# Patient Record
Sex: Male | Born: 1960 | Race: Black or African American | Hispanic: No | Marital: Married | Smoking: Current every day smoker
Health system: Southern US, Community
[De-identification: ages and names within clinical notes are randomized; demographics above are authoritative.]

## PROBLEM LIST (undated history)

## (undated) DIAGNOSIS — J45909 Unspecified asthma, uncomplicated: Secondary | ICD-10-CM

## (undated) HISTORY — PX: SHOULDER SURGERY: SHX246

---

## 1999-05-22 ENCOUNTER — Emergency Department (HOSPITAL_COMMUNITY): Admission: EM | Admit: 1999-05-22 | Discharge: 1999-05-22 | Payer: Self-pay | Admitting: Emergency Medicine

## 1999-11-13 ENCOUNTER — Emergency Department (HOSPITAL_COMMUNITY): Admission: EM | Admit: 1999-11-13 | Discharge: 1999-11-13 | Payer: Self-pay | Admitting: *Deleted

## 2000-12-24 ENCOUNTER — Emergency Department (HOSPITAL_COMMUNITY): Admission: EM | Admit: 2000-12-24 | Discharge: 2000-12-24 | Payer: Self-pay | Admitting: Emergency Medicine

## 2001-04-01 ENCOUNTER — Encounter: Payer: Self-pay | Admitting: Emergency Medicine

## 2001-04-01 ENCOUNTER — Emergency Department (HOSPITAL_COMMUNITY): Admission: EM | Admit: 2001-04-01 | Discharge: 2001-04-01 | Payer: Self-pay

## 2001-08-30 ENCOUNTER — Emergency Department (HOSPITAL_COMMUNITY): Admission: EM | Admit: 2001-08-30 | Discharge: 2001-08-30 | Payer: Self-pay | Admitting: Emergency Medicine

## 2002-03-30 ENCOUNTER — Emergency Department (HOSPITAL_COMMUNITY): Admission: EM | Admit: 2002-03-30 | Discharge: 2002-03-31 | Payer: Self-pay | Admitting: Emergency Medicine

## 2002-03-30 ENCOUNTER — Encounter: Payer: Self-pay | Admitting: Emergency Medicine

## 2002-04-28 ENCOUNTER — Emergency Department (HOSPITAL_COMMUNITY): Admission: EM | Admit: 2002-04-28 | Discharge: 2002-04-29 | Payer: Self-pay | Admitting: Emergency Medicine

## 2002-04-28 ENCOUNTER — Encounter: Payer: Self-pay | Admitting: Emergency Medicine

## 2002-05-01 ENCOUNTER — Ambulatory Visit (HOSPITAL_BASED_OUTPATIENT_CLINIC_OR_DEPARTMENT_OTHER): Admission: RE | Admit: 2002-05-01 | Discharge: 2002-05-01 | Payer: Self-pay | Admitting: Orthopedic Surgery

## 2002-11-08 ENCOUNTER — Emergency Department (HOSPITAL_COMMUNITY): Admission: EM | Admit: 2002-11-08 | Discharge: 2002-11-08 | Payer: Self-pay | Admitting: Emergency Medicine

## 2002-11-19 ENCOUNTER — Encounter: Payer: Self-pay | Admitting: *Deleted

## 2002-11-19 ENCOUNTER — Emergency Department (HOSPITAL_COMMUNITY): Admission: EM | Admit: 2002-11-19 | Discharge: 2002-11-19 | Payer: Self-pay | Admitting: *Deleted

## 2003-02-06 ENCOUNTER — Emergency Department (HOSPITAL_COMMUNITY): Admission: EM | Admit: 2003-02-06 | Discharge: 2003-02-06 | Payer: Self-pay | Admitting: *Deleted

## 2003-02-09 ENCOUNTER — Encounter: Admission: RE | Admit: 2003-02-09 | Discharge: 2003-02-09 | Payer: Self-pay | Admitting: Internal Medicine

## 2003-02-21 ENCOUNTER — Emergency Department (HOSPITAL_COMMUNITY): Admission: AD | Admit: 2003-02-21 | Discharge: 2003-02-21 | Payer: Self-pay | Admitting: Emergency Medicine

## 2003-02-26 ENCOUNTER — Encounter: Admission: RE | Admit: 2003-02-26 | Discharge: 2003-02-26 | Payer: Self-pay | Admitting: Internal Medicine

## 2003-08-06 ENCOUNTER — Emergency Department (HOSPITAL_COMMUNITY): Admission: EM | Admit: 2003-08-06 | Discharge: 2003-08-06 | Payer: Self-pay | Admitting: Family Medicine

## 2003-08-06 ENCOUNTER — Emergency Department (HOSPITAL_COMMUNITY): Admission: EM | Admit: 2003-08-06 | Discharge: 2003-08-06 | Payer: Self-pay | Admitting: Emergency Medicine

## 2003-10-03 ENCOUNTER — Emergency Department (HOSPITAL_COMMUNITY): Admission: AD | Admit: 2003-10-03 | Discharge: 2003-10-03 | Payer: Self-pay | Admitting: Family Medicine

## 2003-11-29 ENCOUNTER — Emergency Department (HOSPITAL_COMMUNITY): Admission: EM | Admit: 2003-11-29 | Discharge: 2003-11-29 | Payer: Self-pay | Admitting: Family Medicine

## 2004-02-07 ENCOUNTER — Emergency Department (HOSPITAL_COMMUNITY): Admission: EM | Admit: 2004-02-07 | Discharge: 2004-02-07 | Payer: Self-pay | Admitting: Emergency Medicine

## 2004-08-07 ENCOUNTER — Emergency Department (HOSPITAL_COMMUNITY): Admission: EM | Admit: 2004-08-07 | Discharge: 2004-08-07 | Payer: Self-pay | Admitting: Emergency Medicine

## 2004-08-20 ENCOUNTER — Emergency Department (HOSPITAL_COMMUNITY): Admission: AD | Admit: 2004-08-20 | Discharge: 2004-08-20 | Payer: Self-pay | Admitting: Family Medicine

## 2004-09-27 ENCOUNTER — Emergency Department (HOSPITAL_COMMUNITY): Admission: EM | Admit: 2004-09-27 | Discharge: 2004-09-27 | Payer: Self-pay | Admitting: Family Medicine

## 2005-03-05 ENCOUNTER — Emergency Department (HOSPITAL_COMMUNITY): Admission: EM | Admit: 2005-03-05 | Discharge: 2005-03-06 | Payer: Self-pay | Admitting: *Deleted

## 2005-06-08 ENCOUNTER — Emergency Department (HOSPITAL_COMMUNITY): Admission: EM | Admit: 2005-06-08 | Discharge: 2005-06-08 | Payer: Self-pay | Admitting: Family Medicine

## 2005-09-14 ENCOUNTER — Emergency Department (HOSPITAL_COMMUNITY): Admission: EM | Admit: 2005-09-14 | Discharge: 2005-09-14 | Payer: Self-pay | Admitting: Emergency Medicine

## 2005-12-07 ENCOUNTER — Emergency Department (HOSPITAL_COMMUNITY): Admission: EM | Admit: 2005-12-07 | Discharge: 2005-12-07 | Payer: Self-pay | Admitting: Emergency Medicine

## 2006-08-07 ENCOUNTER — Emergency Department (HOSPITAL_COMMUNITY): Admission: EM | Admit: 2006-08-07 | Discharge: 2006-08-07 | Payer: Self-pay | Admitting: Emergency Medicine

## 2007-12-07 ENCOUNTER — Emergency Department (HOSPITAL_COMMUNITY): Admission: EM | Admit: 2007-12-07 | Discharge: 2007-12-07 | Payer: Self-pay | Admitting: Emergency Medicine

## 2008-09-19 ENCOUNTER — Emergency Department (HOSPITAL_COMMUNITY): Admission: EM | Admit: 2008-09-19 | Discharge: 2008-09-19 | Payer: Self-pay | Admitting: Family Medicine

## 2008-12-13 ENCOUNTER — Emergency Department (HOSPITAL_COMMUNITY): Admission: EM | Admit: 2008-12-13 | Discharge: 2008-12-13 | Payer: Self-pay | Admitting: Family Medicine

## 2010-05-25 ENCOUNTER — Ambulatory Visit: Payer: Self-pay | Admitting: Family Medicine

## 2010-09-20 ENCOUNTER — Inpatient Hospital Stay (INDEPENDENT_AMBULATORY_CARE_PROVIDER_SITE_OTHER)
Admission: RE | Admit: 2010-09-20 | Discharge: 2010-09-20 | Disposition: A | Discharge status: Home / Self Care | Payer: Managed Care, Other (non HMO) | Source: Ambulatory Visit

## 2010-09-20 DIAGNOSIS — M549 Dorsalgia, unspecified: Secondary | ICD-10-CM

## 2010-09-20 DIAGNOSIS — IMO0002 Reserved for concepts with insufficient information to code with codable children: Secondary | ICD-10-CM

## 2010-11-03 NOTE — Op Note (Signed)
   NAME:  BLESSED, COTHAM NO.:  [id number]   MEDICAL RECORD NO.:  [id number]                   PATIENT TYPE:  AMB   LOCATION:  DSC                                  FACILITY:  [facility]   PHYSICIAN:  Cindee Salt, M.D.                    DATE OF BIRTH:  [birth year]   DATE OF PROCEDURE:  05/01/2002  DATE OF DISCHARGE:                                 OPERATIVE REPORT   PREOPERATIVE DIAGNOSIS:  Infection in the left little finger with probable  epidermal inclusion cyst.   POSTOPERATIVE DIAGNOSIS:  Infection in the left little finger with probable  epidermal inclusion cyst.   OPERATION:  Incision and drainage, removal of epidermal inclusion cyst, left  little finger.   SURGEON:  Cindee Salt, M.D.   ASSISTANT:  R.N.   ANESTHESIA:  Local.   INDICATIONS:  The patient is a 50 year old male with a history of  swelling  of his left little  finger. He was seen in the [facility] emergency  room, referred. He has a small pit on the proximal interphalangeal joint. He  was given an antibiotic along with a pain medication.   DESCRIPTION OF PROCEDURE:  The patient was brought to the operating room  where he was  prepped and draped using Duraprep, the left arm free. A  metacarpal block was given with 1% Xylocaine without epinephrine. The  tourniquet  was placed on the base of the finger was via a 1/4 inch Penrose.   After adequate anesthesia, an oblique incision was made. Purulent material  was immediately encountered. Cultures were taken for both aerobic and  anaerobic media. The area was evacuated. This was directly beneath the pit  in the skin. The pit was elliptically incised revealing a ruptured probable  epidermal inclusion cyst. The specimen was sent to pathology.   The wound was irrigated and packed. A sterile compressive dressing was  applied. The patient tolerated the procedure well. He is discharged home to  return on Monday on Vicodin and  Keflex.                                               Cindee Salt, M.D.    GK/MEDQ  D:  05/01/2002  T:  05/01/2002  Job:  [id number]

## 2010-11-23 ENCOUNTER — Inpatient Hospital Stay (INDEPENDENT_AMBULATORY_CARE_PROVIDER_SITE_OTHER)
Admission: RE | Admit: 2010-11-23 | Discharge: 2010-11-23 | Disposition: A | Discharge status: Home / Self Care | Payer: Managed Care, Other (non HMO) | Source: Ambulatory Visit | Attending: Family Medicine | Admitting: Family Medicine

## 2010-11-23 DIAGNOSIS — K047 Periapical abscess without sinus: Secondary | ICD-10-CM

## 2011-04-13 ENCOUNTER — Inpatient Hospital Stay (INDEPENDENT_AMBULATORY_CARE_PROVIDER_SITE_OTHER)
Admission: RE | Admit: 2011-04-13 | Discharge: 2011-04-13 | Disposition: A | Discharge status: Home / Self Care | Payer: Managed Care, Other (non HMO) | Source: Ambulatory Visit | Attending: Family Medicine | Admitting: Family Medicine

## 2011-04-13 DIAGNOSIS — K1120 Sialoadenitis, unspecified: Secondary | ICD-10-CM

## 2011-04-13 LAB — POCT RAPID STREP A: Streptococcus, Group A Screen (Direct): NEGATIVE

## 2011-11-24 ENCOUNTER — Emergency Department (INDEPENDENT_AMBULATORY_CARE_PROVIDER_SITE_OTHER): Payer: Managed Care, Other (non HMO)

## 2011-11-24 ENCOUNTER — Encounter (HOSPITAL_COMMUNITY): Payer: Self-pay

## 2011-11-24 ENCOUNTER — Emergency Department (INDEPENDENT_AMBULATORY_CARE_PROVIDER_SITE_OTHER)
Admission: EM | Admit: 2011-11-24 | Discharge: 2011-11-24 | Disposition: A | Discharge status: Home / Self Care | Payer: Managed Care, Other (non HMO) | Source: Home / Self Care | Attending: Emergency Medicine | Admitting: Emergency Medicine

## 2011-11-24 DIAGNOSIS — M20019 Mallet finger of unspecified finger(s): Secondary | ICD-10-CM

## 2011-11-24 NOTE — ED Provider Notes (Signed)
History     CSN: [id number]  Arrival date & time 11/24/11  1831   First MD Initiated Contact with Patient 11/24/11 1959      Chief Complaint  Patient presents with  . Finger Injury    (Consider location/radiation/quality/duration/timing/severity/associated sxs/prior treatment) HPI Comments: Pt was lifting an engine last week when finger became crushed between engine and cement wall.  Now cannot extend pinky finger all the way.   Patient is a 51 y.o. male presenting with hand injury. The history is provided by the patient.  Hand Injury  Incident onset: a week ago. The incident occurred at home. Injury mechanism: crush injury. The pain is present in the right fingers. The quality of the pain is described as aching. The pain is at a severity of 8/10. The pain is mild. The pain has been improving since the incident. Pertinent negatives include no fever. The symptoms are aggravated by use. He has tried nothing for the symptoms.    History reviewed. No pertinent past medical history.  History reviewed. No pertinent past surgical history.  History reviewed. No pertinent family history.  History  Substance Use Topics  . Smoking status: Current Everyday Smoker  . Smokeless tobacco: Not on file  . Alcohol Use: No      Review of Systems  Constitutional: Negative for fever and chills.  Musculoskeletal:       Finger injury  Neurological: Positive for weakness. Negative for numbness.    Allergies  Review of patient's allergies indicates no known allergies.  Home Medications   Current Outpatient Rx  Name Route Sig Dispense Refill  . NAPROXEN SODIUM 220 MG PO TABS Oral Take 220 mg by mouth 2 (two) times daily with a meal.      BP 111/76  Pulse 74  Temp(Src) 98 F (36.7 C) (Oral)  Resp 18  SpO2 99%  Physical Exam  Constitutional: He appears well-developed and well-nourished. No distress.  Pulmonary/Chest: Effort normal.  Musculoskeletal:       Right hand: He exhibits  deformity and swelling. He exhibits normal range of motion, no tenderness and no bony tenderness.       Pt cannot extend DIP of R fifth finger, it is always in flexion.  No pain with passive extension of finger. Mild swelling of R 5th finger.   Neurological:       Sensation in R 5th finger normal    ED Course  Procedures (including critical care time)  Labs Reviewed - No data to display Dg Finger Little Right  11/24/2011  *RADIOLOGY REPORT*  Clinical Data: Crush injury to the little finger, unable to straighten digit  RIGHT LITTLE FINGER 2+V  Comparison: None.  Findings: Tiny osseous density along the dorsal aspect of the middle phalanx, compatible with a dorsal avulsion injury of the base of the distal phalanx.  This appearance suggests avulsion of the extensor digitorum tendon (mallet finger).  IMPRESSION: Dorsal avulsion injury at the DIP joint, likely reflecting avulsion of the extensor digitorum tendon (mallet finger).  Original Report Authenticated By: Charline Bills, M.D.     1. Mallet finger       MDM  Pt splinted in hyperextension by tech.  Emphasized to pt need for f/u with hand specialist if pt is to regain function in finger.        Cathlyn Parsons, NP 11/24/11 2007

## 2011-11-24 NOTE — ED Provider Notes (Signed)
Medical screening examination/treatment/procedure(s) were performed by non-physician practitioner and as supervising physician I was immediately available for consultation/collaboration.  Rowyn Spilde   Bula Cavalieri, MD 11/24/11 2008 

## 2011-11-24 NOTE — Discharge Instructions (Signed)
Call Dr. Carlos Levering office Monday morning to schedule an appointment.  Make sure to tell them how long ago the injury happened.   Mallet Finger You have a hand injury called mallet finger. This means that the tendon which straightens the fingertip is torn. This injury usually results from jamming the end of the finger. A small fracture at the joint may also be present. Most of the time, this injury can be treated with a simple splint. Keep your finger elevated and use an icepack on it every 2-3 hours for several days to help reduce pain and to keep the swelling down. Although the injury often causes a deformity at the joint, surgery is not needed if a splint is used properly. A properly fit special finger splint will hold the tendon ends together until the tendon is healed. You must wear this splint continuously for at least 6-8 weeks, or you may end up with a permanent deformity. If the end joint of the finger bends at any time before healing is complete, the treatment may fail. Please do not remove this splint until your doctor approves. Be sure to see the doctor for follow-up care as recommended. If your finger becomes more painful, swollen, or red, you should see your doctor right away. MAKE SURE YOU:   Understand these instructions.   Will watch your condition.   Will get help right away if you are not doing well or get worse.  Document Released: 07/12/2004 Document Revised: 05/24/2011 Document Reviewed: 06/04/2005 [facility] Patient Information 2012 [place], [place].

## 2011-11-24 NOTE — ED Notes (Signed)
Pt was lifting engine out of car one week ago and finger became lodged in engine and having swelling and pain of rt pinkie finger.

## 2011-12-08 ENCOUNTER — Emergency Department (HOSPITAL_COMMUNITY)
Admission: EM | Admit: 2011-12-08 | Discharge: 2011-12-08 | Disposition: A | Discharge status: Home / Self Care | Payer: Managed Care, Other (non HMO) | Attending: Emergency Medicine | Admitting: Emergency Medicine

## 2011-12-08 ENCOUNTER — Encounter (HOSPITAL_COMMUNITY): Payer: Self-pay | Admitting: Nurse Practitioner

## 2011-12-08 DIAGNOSIS — K029 Dental caries, unspecified: Secondary | ICD-10-CM | POA: Insufficient documentation

## 2011-12-08 DIAGNOSIS — F17200 Nicotine dependence, unspecified, uncomplicated: Secondary | ICD-10-CM | POA: Insufficient documentation

## 2011-12-08 DIAGNOSIS — Z882 Allergy status to sulfonamides status: Secondary | ICD-10-CM | POA: Insufficient documentation

## 2011-12-08 MED ORDER — HYDROCODONE-ACETAMINOPHEN 5-325 MG PO TABS
2.0000 | ORAL_TABLET | Freq: Once | ORAL | Status: AC
  Administered 2011-12-08: 2 via ORAL
  Filled 2011-12-08: qty 2

## 2011-12-08 MED ORDER — CLINDAMYCIN HCL 300 MG PO CAPS
300.0000 mg | ORAL_CAPSULE | Freq: Once | ORAL | Status: DC
  Filled 2011-12-08 (×2): qty 1

## 2011-12-08 MED ORDER — CLINDAMYCIN HCL 300 MG PO CAPS: 300.0000 mg | ORAL_CAPSULE | Freq: Three times a day (TID) | ORAL | Status: AC

## 2011-12-08 NOTE — ED Provider Notes (Signed)
History   This chart was scribed for Cyndra Numbers, MD scribed by Magnus Sinning. The patient was seen in room TR05C/TR05C seen at 16:53.   CSN: [id number]  Arrival date & time 12/08/11  1411   First MD Initiated Contact with Patient 12/08/11 1646      Chief Complaint  Patient presents with  . Dental Pain    (Consider location/radiation/quality/duration/timing/severity/associated sxs/prior treatment) HPI Steven Cobb is a 51 y.o. male who presents to the Emergency Department complaining of gradually worsening moderate lower right dental pain with associated nausea,and inability to eat due to pain. Says that he has an appt on 12/24/11 for tooth extraction, but he states he is concerned that he has a dental infection because it is similar to past dental infection, which he states was treated with clindamycin. Explains he has taken aleve with moderate relief. Denies vomiting,SOB or fevers.   History reviewed. No pertinent past medical history.  History reviewed. No pertinent past surgical history.  History reviewed. No pertinent family history.  History  Substance Use Topics  . Smoking status: Current Everyday Smoker -- 0.0 packs/day  . Smokeless tobacco: Not on file  . Alcohol Use: No     Review of Systems 10 Systems reviewed and are negative for acute change except as noted in the HPI. Allergies  Sulfa antibiotics  Home Medications   Current Outpatient Rx  Name Route Sig Dispense Refill  . NAPROXEN SODIUM 220 MG PO TABS Oral Take 440-660 mg by mouth every 8 (eight) hours as needed. For pain      BP 113/72  Pulse 81  Temp 98.4 F (36.9 C) (Oral)  Resp 16  Ht 5\' 8"  (1.727 m)  Wt 175 lb (79.379 kg)  BMI 26.61 kg/m2  SpO2 98%  Physical Exam  Nursing note and vitals reviewed. GEN: Well-developed, well-nourished male in no distress HEENT: Atraumatic, normocephalic.Numerous dental caries. No buckle or sublingual swelling.  EYES: PERRLA BL, no scleral  icterus. NECK: Trachea midline, no meningismus CV: regular rate and rhythm. No murmurs, rubs, or gallops PULM: No respiratory distress.  No crackles, wheezes, or rales. Neuro: cranial nerves grossly 2-12 intact, no abnormalities of strength or sensation, A and O x 3 MSK: Patient moves all 4 extremities symmetrically, no deformity, edema, or injury noted Skin: No rashes petechiae, purpura, or jaundice Psych: no abnormality of mood   ED Course  Procedures (including critical care time) DIAGNOSTIC STUDIES: Oxygen Saturation is 98% on room air, normal by my interpretation.    COORDINATION OF CARE:  Labs Reviewed - No data to display No results found.   1. Dental caries       MDM  Patient was evaluated by myself. Based on evaluation patient did not have any signs of airway compromise or toxicity.  He was handling his secretions well. Patient has planned followup with a dentist. He did have numerous dental caries. I did not have any concern for dental abscess. Patient was given clindamycin again to prevent further deterioration. He also receive 2 tabs of Vicodin in the ER but will continue to take his home Aleve for pain control. Patient was discharged a seven-day prescription of clindamycin. He will followup with his dentist for further management. I personally performed the services described in this documentation, which was scribed in my presence. The recorded information has been reviewed and considered.           Cyndra Numbers, MD 12/08/11 1744

## 2011-12-08 NOTE — ED Notes (Signed)
Pt reports over past 2 days lower R dental pain increasingly worse. Does have dental appt for tooth extraction on 7/8 but worried he is getting an infection

## 2012-11-07 ENCOUNTER — Encounter (HOSPITAL_COMMUNITY): Payer: Self-pay | Admitting: Cardiology

## 2012-11-07 ENCOUNTER — Emergency Department (HOSPITAL_COMMUNITY): Payer: Managed Care, Other (non HMO)

## 2012-11-07 ENCOUNTER — Emergency Department (HOSPITAL_COMMUNITY)
Admission: EM | Admit: 2012-11-07 | Discharge: 2012-11-07 | Disposition: A | Discharge status: Home / Self Care | Payer: Managed Care, Other (non HMO) | Attending: Emergency Medicine | Admitting: Emergency Medicine

## 2012-11-07 DIAGNOSIS — Z7982 Long term (current) use of aspirin: Secondary | ICD-10-CM | POA: Insufficient documentation

## 2012-11-07 DIAGNOSIS — F17200 Nicotine dependence, unspecified, uncomplicated: Secondary | ICD-10-CM | POA: Insufficient documentation

## 2012-11-07 DIAGNOSIS — R0789 Other chest pain: Secondary | ICD-10-CM

## 2012-11-07 LAB — BASIC METABOLIC PANEL
BUN: 8 mg/dL (ref 6–23)
CO2: 25 mEq/L (ref 19–32)
Calcium: 9.5 mg/dL (ref 8.4–10.5)
Chloride: 107 mEq/L (ref 96–112)
Creatinine, Ser: 0.99 mg/dL (ref 0.50–1.35)
GFR calc Af Amer: >90 mL/min (ref >90)
GFR calc non Af Amer: >90 mL/min (ref >90)
Glucose, Bld: 129 mg/dL — ABNORMAL HIGH (ref 70–99)
Potassium: 3.4 mEq/L — ABNORMAL LOW (ref 3.5–5.1)
Sodium: 142 mEq/L (ref 135–145)

## 2012-11-07 LAB — POCT I-STAT TROPONIN I: Troponin i, poc: 0 ng/mL (ref 0.00–0.08)

## 2012-11-07 LAB — CBC
HCT: 39.6 % (ref 39.0–52.0)
Hemoglobin: 13.7 g/dL (ref 13.0–17.0)
MCH: 31.6 pg (ref 26.0–34.0)
MCHC: 34.6 g/dL (ref 30.0–36.0)
MCV: 91.5 fL (ref 78.0–100.0)
Platelets: 231 10*3/uL (ref 150–400)
RBC: 4.33 MIL/uL (ref 4.22–5.81)
RDW: 14.3 % (ref 11.5–15.5)
WBC: 8.7 10*3/uL (ref 4.0–10.5)

## 2012-11-07 NOTE — ED Notes (Signed)
Pt reports left sided chest pain that has been on-going for the past couple of months. States that the pain became worse today and radiated into the left arm. Also reports some SOB and headache. Denies any cardiac hx, but reports family hx.

## 2012-11-07 NOTE — ED Provider Notes (Signed)
History     CSN: [id number]  Arrival date & time 11/07/12  1226   First MD Initiated Contact with Patient 11/07/12 1258      Chief Complaint  Patient presents with  . Chest Pain    (Consider location/radiation/quality/duration/timing/severity/associated sxs/prior treatment) Patient is a 52 y.o. male presenting with chest pain.  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 He took an online 'heart test' yesterday which told him he was 'high risk' and so he came to the ED for evaluation. He denies any current chest pain, he has No particular provoking or relieving factors. No history of HTN/DM but he is a smoker and has family history of heart disease.   History reviewed. No pertinent past medical history.  Past Surgical History  Procedure Laterality Date  . Shoulder surgery      Family History  Problem Relation Age of Onset  . Coronary artery disease Mother   . Coronary artery disease Father     History  Substance Use Topics  . Smoking status: Current Every Day Smoker -- 0.05 packs/day    Types: Cigarettes  . Smokeless tobacco: Not on file  . Alcohol Use: No      Review of Systems  Cardiovascular: Positive for chest pain.   All other systems reviewed and are negative except as noted in HPI.   Allergies  Sulfa antibiotics  Home Medications   Current Outpatient Rx  Name  Route  Sig  Dispense  Refill  . acetaminophen (TYLENOL) 325 MG tablet   Oral   Take 650 mg by mouth every 6 (six) hours as needed for pain.         Marland Kitchen aspirin 325 MG tablet   Oral   Take 650 mg by mouth daily.         Marland Kitchen ibuprofen (ADVIL,MOTRIN) 200 MG tablet   Oral   Take 400-800 mg by mouth every 6 (six) hours as needed for pain.         . phenylephrine (NEO-SYNEPHRINE) 1 % nasal spray   Nasal   Place 1-2 drops into the nose every 4  (four) hours as needed for congestion.           BP 110/73  Pulse 70  Temp(Src) 98.1 F (36.7 C) (Oral)  Resp 11  SpO2 99%  Physical Exam  Nursing note and vitals reviewed. Constitutional: He is oriented to person, place, and time. He appears well-developed and well-nourished.  HENT:  Head: Normocephalic and atraumatic.  Eyes: EOM are normal. Pupils are equal, round, and reactive to light.  Neck: Normal range of motion. Neck supple.  Cardiovascular: Normal rate, normal heart sounds and intact distal pulses.   Pulmonary/Chest: Effort normal and breath sounds normal. He exhibits tenderness (mild diffuse).  Abdominal: Bowel sounds are normal. He exhibits no distension. There is no tenderness.  Musculoskeletal: Normal range of motion. He exhibits no edema and no tenderness.  Neurological: He is alert and oriented to person, place, and time. He has normal strength. No cranial nerve deficit or sensory deficit.  Skin: Skin is warm and dry. No rash noted.  Psychiatric: He has a normal mood and affect.    ED Course  Procedures (including critical care time)  Labs Reviewed  BASIC METABOLIC PANEL - Abnormal; Notable for the following:    Potassium 3.4 (*)  Glucose, Bld 129 (*)    All other components within normal limits  CBC  POCT I-STAT TROPONIN I   Dg Chest 2 View  11/07/2012   *RADIOLOGY REPORT*  Clinical Data: Chest pain.  CHEST - 2 VIEW  Comparison: 12/07/2007.  Findings: Trachea is midline.  Heart size stable.  Lungs are low in volume but clear.  No pleural fluid.  IMPRESSION: No acute findings.   Original Report Authenticated By: Leanna Battles, M.D.     1. Atypical chest pain       MDM   Date: 11/07/2012  Rate: 81  Rhythm: normal sinus rhythm  QRS Axis: normal  Intervals: normal  ST/T Wave abnormalities: normal  Conduction Disutrbances: none  Narrative Interpretation: unremarkable   3:00 PM Pt with atypical pain, ongoing for weeks. No signs of ischemia or  infarction today. Advised daily ASA, stop smoking, follow up with PCP and/or Cards for followup.         Charles B. Bernette Mayers, MD 11/07/12 [phone]

## 2013-07-06 ENCOUNTER — Encounter (HOSPITAL_COMMUNITY): Payer: Self-pay | Admitting: Emergency Medicine

## 2013-07-06 ENCOUNTER — Emergency Department (HOSPITAL_COMMUNITY)
Admission: EM | Admit: 2013-07-06 | Discharge: 2013-07-06 | Disposition: A | Discharge status: Home / Self Care | Payer: Managed Care, Other (non HMO) | Attending: Emergency Medicine | Admitting: Emergency Medicine

## 2013-07-06 DIAGNOSIS — K029 Dental caries, unspecified: Secondary | ICD-10-CM | POA: Insufficient documentation

## 2013-07-06 DIAGNOSIS — K047 Periapical abscess without sinus: Secondary | ICD-10-CM

## 2013-07-06 DIAGNOSIS — Z7982 Long term (current) use of aspirin: Secondary | ICD-10-CM | POA: Insufficient documentation

## 2013-07-06 DIAGNOSIS — R509 Fever, unspecified: Secondary | ICD-10-CM | POA: Insufficient documentation

## 2013-07-06 DIAGNOSIS — R110 Nausea: Secondary | ICD-10-CM | POA: Insufficient documentation

## 2013-07-06 DIAGNOSIS — F17200 Nicotine dependence, unspecified, uncomplicated: Secondary | ICD-10-CM | POA: Insufficient documentation

## 2013-07-06 MED ORDER — ONDANSETRON HCL 4 MG PO TABS: 4.0000 mg | ORAL_TABLET | Freq: Four times a day (QID) | ORAL | Status: DC

## 2013-07-06 MED ORDER — PENICILLIN V POTASSIUM 500 MG PO TABS: 500.0000 mg | ORAL_TABLET | Freq: Four times a day (QID) | ORAL | Status: DC

## 2013-07-06 NOTE — ED Provider Notes (Signed)
CSN: [id number]     Arrival date & time 07/06/13  1718 History   This chart was scribed for non-physician practitioner Antony MaduraKelly Humes, PA-C working with Hurman HornJohn M Bednar, MD by Donne Anonayla Curran, ED Scribe. This patient was seen in room TR04C/TR04C and the patient's care was started at 1842.   First MD Initiated Contact with Patient 07/06/13 1842     Chief Complaint  Patient presents with  . Dental Pain    The history is provided by the patient. No language interpreter was used.   HPI Comments: Steven Cobb is a 53 y.o. male who presents to the Emergency Department complaining of 2 days of gradual onset, gradually worsening left lower dental pain. He states he had an abscess that he popped yesterday, and it has been draining pus since. He reports associated fever (99) and nausea. He tried ibuprofen with moderate relief. He denies inability to swallow, inability to swallow or any other symptoms. He does not currently have a dentist. He currently smokes .05 pack/day.    History reviewed. No pertinent past medical history. Past Surgical History  Procedure Laterality Date  . Shoulder surgery     Family History  Problem Relation Age of Onset  . Coronary artery disease Mother   . Coronary artery disease Father    History  Substance Use Topics  . Smoking status: Current Every Day Smoker -- 0.05 packs/day    Types: Cigarettes  . Smokeless tobacco: Not on file  . Alcohol Use: No    Review of Systems  Constitutional: Positive for fever.  HENT: Positive for dental problem. Negative for trouble swallowing.   Gastrointestinal: Positive for nausea.  All other systems reviewed and are negative.    Allergies  Sulfa antibiotics  Home Medications   Current Outpatient Rx  Name  Route  Sig  Dispense  Refill  . acetaminophen (TYLENOL) 325 MG tablet   Oral   Take 650 mg by mouth every 6 (six) hours as needed for pain.         Marland Kitchen. aspirin 325 MG tablet   Oral   Take 650 mg by mouth daily.          Marland Kitchen. ibuprofen (ADVIL,MOTRIN) 200 MG tablet   Oral   Take 400-800 mg by mouth every 6 (six) hours as needed for pain.         Marland Kitchen. ondansetron (ZOFRAN) 4 MG tablet   Oral   Take 1 tablet (4 mg total) by mouth every 6 (six) hours.   12 tablet   0   . penicillin v potassium (VEETID) 500 MG tablet   Oral   Take 1 tablet (500 mg total) by mouth 4 (four) times daily.   40 tablet   0   . phenylephrine (NEO-SYNEPHRINE) 1 % nasal spray   Nasal   Place 1-2 drops into the nose every 4 (four) hours as needed for congestion.          BP 127/87  Pulse 90  Temp(Src) 98.2 F (36.8 C) (Oral)  Resp 16  Ht 5\' 8"  (1.727 m)  Wt 171 lb (77.565 kg)  BMI 26.01 kg/m2  SpO2 97%  Physical Exam  Nursing note and vitals reviewed. Constitutional: He is oriented to person, place, and time. He appears well-developed and well-nourished. No distress.  HENT:  Head: Normocephalic and atraumatic.  Right Ear: External ear normal. No mastoid tenderness.  Left Ear: External ear normal. No mastoid tenderness.  Nose: Nose normal.  Mouth/Throat: Uvula  is midline, oropharynx is clear and moist and mucous membranes are normal. No trismus in the jaw. Abnormal dentition. Dental abscesses and dental caries present.  Draining dental abscess at base of left lower first premolar. No tenderness to palpation. No trismus or stridor. Patient tolerating secretions without difficulty or drooling  Eyes: Conjunctivae and EOM are normal. Pupils are equal, round, and reactive to light. No scleral icterus.  Neck: Normal range of motion. Neck supple.  No nuchal rigidity or meningismus  Cardiovascular: Normal rate, regular rhythm and intact distal pulses.   Distal radial pulse 2+ in left upper extremity  Pulmonary/Chest: Effort normal. No stridor. No respiratory distress.  Musculoskeletal: Normal range of motion.  Neurological: He is alert and oriented to person, place, and time.  Skin: Skin is warm and dry. No rash  noted. He is not diaphoretic. No erythema. No pallor.  Psychiatric: He has a normal mood and affect. His behavior is normal.    ED Course  Procedures (including critical care time) DIAGNOSTIC STUDIES: Oxygen Saturation is 97% on RA, adequate by my interpretation.    COORDINATION OF CARE: 7:29 PM Discussed treatment plan which includes 500 mg Veetid and 4 mg Zofran with pt at bedside and pt agreed to plan. Advised pt to follow up with dentist, referral given. Return precautions advised.   Labs Review Labs Reviewed - No data to display Imaging Review No results found.  EKG Interpretation   None       MDM   1. Dental abscess    Uncomplicated dental abscess. Patient well and nontoxic appearing, hemodynamically stable, and afebrile. Patient tolerating secretions without difficulty. No trismus or stridor. Airway patent. No red flags or signs concerning for Ludwig's angina. No nuchal rigidity or meningismus. Patient stable for discharge with prescriptions for penicillin and Zofran for nausea. Dentist followup advised. Return precautions provided and patient agreeable to plan with no unaddressed concerns.  I personally performed the services described in this documentation, which was scribed in my presence. The recorded information has been reviewed and is accurate.     Antony Madura, PA-C 07/06/13 [phone]

## 2013-07-06 NOTE — ED Notes (Signed)
Pt with L lower dental pain.  States pus is leaking out.

## 2013-07-06 NOTE — ED Notes (Signed)
Pt states Saturday he noticed an abscess tooth left lower. Pt states that he popped it ans it has been draining in his mouth for the past couple of days. Pt states bad taste and breath.

## 2013-07-06 NOTE — Discharge Instructions (Signed)
Take antibiotics prescribed to for the full course. Do not stop antibiotics early. Takes Zofran as needed for nausea. Take 600 mg ibuprofen every 6 hours as needed for pain control. Follow up with a dentist.  Dental Abscess A dental abscess is a collection of infected fluid (pus) from a bacterial infection in the inner part of the tooth (pulp). It usually occurs at the end of the tooth's root.  CAUSES   Severe tooth decay.  Trauma to the tooth that allows bacteria to enter into the pulp, such as a broken or chipped tooth. SYMPTOMS   Severe pain in and around the infected tooth.  Swelling and redness around the abscessed tooth or in the mouth or face.  Tenderness.  Pus drainage.  Bad breath.  Bitter taste in the mouth.  Difficulty swallowing.  Difficulty opening the mouth.  Nausea.  Vomiting.  Chills.  Swollen neck glands. DIAGNOSIS   A medical and dental history will be taken.  An examination will be performed by tapping on the abscessed tooth.  X-rays may be taken of the tooth to identify the abscess. TREATMENT The goal of treatment is to eliminate the infection. You may be prescribed antibiotic medicine to stop the infection from spreading. A root canal may be performed to save the tooth. If the tooth cannot be saved, it may be pulled (extracted) and the abscess may be drained.  HOME CARE INSTRUCTIONS  Only take over-the-counter or prescription medicines for pain, fever, or discomfort as directed by your caregiver.  Rinse your mouth (gargle) often with salt water ( tsp salt in 8 oz [250 ml] of warm water) to relieve pain or swelling.  Do not drive after taking pain medicine (narcotics).  Do not apply heat to the outside of your face.  Return to your dentist for further treatment as directed. SEEK MEDICAL CARE IF:  Your pain is not helped by medicine.  Your pain is getting worse instead of better. SEEK IMMEDIATE MEDICAL CARE IF:  You have a fever or  persistent symptoms for more than 2 3 days.  You have a fever and your symptoms suddenly get worse.  You have chills or a very bad headache.  You have problems breathing or swallowing.  You have trouble opening your mouth.  You have swelling in the neck or around the eye. Document Released: 06/04/2005 Document Revised: 02/27/2012 Document Reviewed: 09/12/2010 [facility] Patient Information 2014 [place]Care, MarylandLLC.  Emergency Department Resource Guide 1) Find a Doctor and Pay Out of Pocket Although you won't have to find out who is covered by your insurance plan, it is a good idea to ask around and get recommendations. You will then need to call the office and see if the doctor you have chosen will accept you as a new patient and what types of options they offer for patients who are self-pay. Some doctors offer discounts or will set up payment plans for their patients who do not have insurance, but you will need to ask so you aren't surprised when you get to your appointment.  2) Contact Your Local Health Department Not all health departments have doctors that can see patients for sick visits, but many do, so it is worth a call to see if yours does. If you don't know where your local health department is, you can check in your phone book. The CDC also has a tool to help you locate your state's health department, and many state websites also have listings of all of their  local health departments.  3) Find a [facility] If your illness is not likely to be very severe or complicated, you may want to try a walk in clinic. These are popping up all over the country in pharmacies, drugstores, and shopping centers. They're usually staffed by nurse practitioners or physician assistants that have been trained to treat common illnesses and complaints. They're usually fairly quick and inexpensive. However, if you have serious medical issues or chronic medical problems, these are probably not your best  option.  No Primary Care Doctor: - Call Health Connect at  [phone] - they can help you locate a primary care doctor that  accepts your insurance, provides certain services, etc. - Physician Referral Service- [phone]  Chronic Pain Problems: Organization         Address  Phone   Notes  [facility]  [phone] Patients need to be referred by their primary care doctor.   Medication Assistance: Organization         Address  Phone   Notes  [facility] Medication [facility] [address] [place], [place] [address] [phone] --Must be a resident of [facility] -- Must have NO insurance coverage whatsoever (no Medicaid/ Medicare, etc.) -- The pt. MUST have a primary care doctor that directs their care regularly and follows them in the community   MedAssist  [phone]   [employer]  [phone]    Agencies that provide inexpensive medical care: Organization         Address  Phone   Notes  [place]  [phone]   [facility]    [phone]   [facility] [place], [place] [address] [phone]   [place] [address] [place] [phone]   Planned Parenthood    [phone]   [facility]    [phone]   [place] [address], [place] Phone:  [phone], Fax:  [phone] Hours of Operation:  9 am - 6 pm, M-F.  Also accepts Medicaid/Medicare and self-pay.  [facility] for [place], [address], [place] Phone: [phone], Fax: [phone]. Hours of Operation:  8:30 am - 5:30 pm, M-F.  Also accepts Medicaid and self-pay.  [facility] [address] [place] Phone: [phone]   [place], [place], [place] [phone], Ext. [phone] Mondays & Thursdays: 7-9 AM.  First 15  patients are seen on a first come, first serve basis.    [place] Providers:  Organization         Address  Phone   Notes  [facility] [address], [place] [phone] Also accepts self-pay patients.  [facility] [id number] [place], [place]  [phone]   [place], [address], [place] [phone]   [facility] [address], [place] [phone]   Lucianne Lei [address], [place]   [phone] Only accepts [place] Access [place] patients after they have their name applied to their card.   Self-Pay (no insurance) in [facility]:  Organization         Address  Phone   Notes  Sickle Cell Patients,  [employer] [phone]   [facility] [place] [phone]   [facility] [place]  [place], [place], [place] [phone]   [facility]/Dr. Osei-Bonsu  [address], [place] or [address], [place] [phone] Phone number for both [place] and [place] locations is the same.  [facility] [address], [place] [phone]   [facility] [address], [place] or [address] [phone]   [facility] [address], [place] [phone], phone; [phone], fax Sees patients 1st and 3rd Saturday of every month.  Must not qualify for public or private insurance (i.e. Medicaid, Medicare, [place] Health Choice, Veterans' Benefits)  Household income should be no more than 200% of the poverty level The clinic cannot treat you if you are pregnant or think you are pregnant  Sexually transmitted diseases are not treated at the clinic.    Dental  Care: Organization         Address  Phone  Notes  [facility] Department of [facility] [phone] Accepts children up to age 68 who are enrolled in [place] or [place]; pregnant women with a Medicaid card; and children who have applied for Medicaid or [place] Health Choice, but were declined, whose parents can pay a reduced fee at time of service.  [facility] Department of [facility]  [address], [place] [phone] Accepts children up to age 19 who are enrolled in [place] or [place]; pregnant women with a Medicaid card; and children who have applied for Medicaid or [place] Health Choice, but were declined, whose parents can pay a reduced fee at time of service.  [place] Adult Dental Access PROGRAM  [place] [phone] Patients are seen by appointment only. Walk-ins are not accepted. [place] will see patients 26 years of age and older. Monday - Tuesday (8am-5pm) Most Wednesdays (8:30-5pm) $30 per visit, cash only  [facility] Adult Dental Access PROGRAM  [address], [facility] [phone] Patients are seen by appointment only. Walk-ins are not accepted. [place] will see patients 41 years of age and older. One Wednesday Evening (Monthly: Volunteer Based).  $30 per visit, cash only  [place]  [phone] for adults; Children under age 22, call Graduate Pediatric Dentistry at [phone]. Children aged 24-14, please call [phone] to request a pediatric application.  Dental services are provided in all areas of dental care including fillings, crowns and bridges, complete and partial dentures, implants, gum treatment, root canals, and extractions. Preventive care is also provided. Treatment is provided to both adults and children. Patients are selected via a lottery and there is often a waiting list.   [facility] [address] [place]  [phone] [email or web address]   [facility] [address] [place], [place] [phone], Ext. [phone] Second and Fourth Thursday of each month, opens at 6:30 AM; Clinic ends at 9 AM.  Patients are seen on a first-come first-served basis, and a limited number are seen during each clinic.   [facility]  [address] Hillard Danker [place], [place] [phone]   Eligibility Requirements You must  have lived in [place], [place], or [place] counties for at least the last three months.   You cannot be eligible for state or federal sponsored [employer], including [employer], [place], or [employer].   You generally cannot be eligible for healthcare insurance through your employer.    How to apply: Eligibility screenings are held every Tuesday and Wednesday afternoon from 1:00 pm until 4:00 pm. You do not need an appointment for the interview!  [facility] [address], [place], [place]   [place]  [place] Department  [place]  [phone]    Behavioral Health Resources in the Community: Intensive Outpatient Programs Organization         Address  Phone  Notes  [place]. [address] [place], [place] [phone]   [facility] Outpatient [address] [place], [place]   ADS: Alcohol & Drug Svcs [address], [place], [place]   [place] [address]  [place], [place] or [phone]   Substance Abuse Resources Organization         Address  Phone  Notes  Alcohol and Drug Services  [phone]   [place]  [phone]   [facility] [place]   Chinita Pester  [phone]   Residential & Outpatient Substance Abuse Program  [phone]    Psychological Services Organization         Address  Phone  Notes  [facility] [place]  [place]  [phone]   [place] [address], [place] or [phone]    Mobile Crisis Teams Organization         Address  Phone  Notes  [facility], Mobile Crisis Care Unit  [phone]   [facility]  [address] [place], [place]   [facility] Levels [address], [place] [phone]    Self-Help/Support Groups Organization         Address  Phone             Notes  [place]. of [place] - variety of support groups  [place] Call for more information  Narcotics Anonymous (NA), [facility] [address], [employer] [place]  2 meetings at this location   Special educational needs teacher         Address  Phone  Notes  [facility]idential [facility] [place],    [place]  [phone]   [facility]  [address] [place] [id number], [place], [place]   [place], [place] [phone] Admissions: 8am-3pm M-F  Incentives Substance [place] [address],    [place], [place] [phone]   [facility] [address] Jadene Pierini [place], [place]   [facility] [address],  [place], [place]   [facility] - Intensive Outpatient [address] Kristeen [address], [place], [place]   [facility] ([place].) [place].,  [place], [place] or [phone]   [facility] (RTS) [address], [place], [place] Accepts Medicaid  [facility] [place] [address]  [place] [phone] Substance Abuse/Addiction Treatment   [facility] Resources Organization         Address  Phone  Notes  [facility]  [phone]   Domenic Schwab, PhD [address] Arlis Porta [place], [place]   [phone] or [phone]   [place]illian [place], [place] [phone]   [address], [place], [place] [phone] Insurance/Medicaid/sponsorship through [facility] [place] and Families [address], [address] [place]                                    [place], [place] [phone] [place] [address], [place] [phone]    Dr. Adele Schilder  [phone]   [facility] Dept. 1) [address] [place] 2) [place] 3)  [place] [address], [place] [phone]  [phone]   [place] [phone] or [phone] (After Hours)

## 2013-07-09 NOTE — ED Provider Notes (Signed)
Medical screening examination/treatment/procedure(s) were performed by non-physician practitioner and as supervising physician I was immediately available for consultation/collaboration.  EKG Interpretation   None        Hurman HornJohn M Bednar, MD 07/09/13 [phone]

## 2013-08-16 ENCOUNTER — Emergency Department (HOSPITAL_COMMUNITY)
Admission: EM | Admit: 2013-08-16 | Discharge: 2013-08-16 | Disposition: A | Discharge status: Home / Self Care | Payer: Managed Care, Other (non HMO) | Attending: Emergency Medicine | Admitting: Emergency Medicine

## 2013-08-16 ENCOUNTER — Encounter (HOSPITAL_COMMUNITY): Payer: Self-pay | Admitting: Emergency Medicine

## 2013-08-16 DIAGNOSIS — R599 Enlarged lymph nodes, unspecified: Secondary | ICD-10-CM | POA: Insufficient documentation

## 2013-08-16 DIAGNOSIS — Z7982 Long term (current) use of aspirin: Secondary | ICD-10-CM | POA: Insufficient documentation

## 2013-08-16 DIAGNOSIS — F17200 Nicotine dependence, unspecified, uncomplicated: Secondary | ICD-10-CM | POA: Insufficient documentation

## 2013-08-16 DIAGNOSIS — K047 Periapical abscess without sinus: Secondary | ICD-10-CM

## 2013-08-16 DIAGNOSIS — K029 Dental caries, unspecified: Secondary | ICD-10-CM | POA: Insufficient documentation

## 2013-08-16 DIAGNOSIS — R110 Nausea: Secondary | ICD-10-CM | POA: Insufficient documentation

## 2013-08-16 MED ORDER — PENICILLIN V POTASSIUM 500 MG PO TABS: 500.0000 mg | ORAL_TABLET | Freq: Three times a day (TID) | ORAL | Status: DC

## 2013-08-16 MED ORDER — HYDROCODONE-ACETAMINOPHEN 5-325 MG PO TABS: 1.0000 | ORAL_TABLET | ORAL | Status: DC | PRN

## 2013-08-16 NOTE — Discharge Instructions (Signed)
Abscessed Tooth  An abscessed tooth is an infection around your tooth. It may be caused by holes or damage to the tooth (cavity) or a dental disease. An abscessed tooth causes mild to very bad pain in and around the tooth. See your dentist right away if you have tooth or gum pain.  HOME CARE   Take your medicine as told. Finish it even if you start to feel better.   Do not drive after taking pain medicine.   Rinse your mouth (gargle) often with salt water ( teaspoon salt in 8 ounces of warm water).   Do not apply heat to the outside of your face.  GET HELP RIGHT AWAY IF:    You have a temperature by mouth above 102 F (38.9 C), not controlled by medicine.   You have chills and a very bad headache.   You have problems breathing or swallowing.   Your mouth will not open.   You develop puffiness (swelling) on the neck or around the eye.   Your pain is not helped by medicine.   Your pain is getting worse instead of better.  MAKE SURE YOU:    Understand these instructions.   Will watch your condition.   Will get help right away if you are not doing well or get worse.  Document Released: 11/21/2007 Document Revised: 08/27/2011 Document Reviewed: 09/12/2010  ExitCare Patient Information 2014 ExitCare, LLC.

## 2013-08-16 NOTE — ED Notes (Signed)
States he has an abscessed tooth, he has an appt at the dental clinic in 2 weeks but states they will not see him if the tooth is infected

## 2013-08-16 NOTE — ED Provider Notes (Signed)
Medical screening examination/treatment/procedure(s) were performed by non-physician practitioner and as supervising physician I was immediately available for consultation/collaboration.  Robert Lockwood, MD 08/16/13 1643 

## 2013-08-16 NOTE — ED Provider Notes (Signed)
CSN: [id number]     Arrival date & time 08/16/13  1347 History  This chart was scribed for non-physician practitioner Fayrene HelperBowie Tran, working with Gerhard Munchobert Lockwood, MD by Carl Bestelina Holson, ED Scribe. This patient was seen in room TR04C/TR04C and the patient's care was started at 3:08 PM.    Chief Complaint  Patient presents with  . Dental Pain     Patient is a 53 y.o. male presenting with tooth pain. The history is provided by the patient. No language interpreter was used.  Dental Pain Associated symptoms: no fever    HPI Comments: Steven Cobb is a 53 y.o. male who presents to the Emergency Department complaining of constant dental pain located in the right upper tooth caused by an abscessed tooth that started two days ago.  The patient [facility]  He states that he has experienced pain in this.  He denies fever as an associated symptom.  He lists nausea as an associated symptom.  He states that everything aggravates his pain and he can taste the pus from the abscess intermittently.  He denies having a history of chronic health problems.     History reviewed. No pertinent past medical history. Past Surgical History  Procedure Laterality Date  . Shoulder surgery     Family History  Problem Relation Age of Onset  . Coronary artery disease Mother   . Coronary artery disease Father    History  Substance Use Topics  . Smoking status: Current Every Day Smoker -- 0.05 packs/day    Types: Cigarettes  . Smokeless tobacco: Not on file  . Alcohol Use: No    Review of Systems  Constitutional: Negative for fever.  HENT: Positive for dental problem.   Gastrointestinal: Positive for nausea.  All other systems reviewed and are negative.      Allergies  Sulfa antibiotics  Home Medications   Current Outpatient Rx  Name  Route  Sig  Dispense  Refill  . aspirin 325 MG tablet   Oral   Take 650 mg by mouth daily.          Marland Kitchen. ibuprofen (ADVIL,MOTRIN) 200 MG tablet   Oral   Take 400-800 mg by mouth every 6 (six) hours as needed for pain.         . phenylephrine (NEO-SYNEPHRINE) 1 % nasal spray   Nasal   Place 1-2 drops into the nose every 4 (four) hours as needed for congestion.          Triage Vitals: BP 114/66  Pulse 80  Temp(Src) 98.4 F (36.9 C) (Oral)  Resp 18  Wt 175 lb (79.379 kg)  SpO2 100%  Physical Exam  Nursing note and vitals reviewed. Constitutional: He is oriented to person, place, and time. He appears well-developed and well-nourished.  HENT:  Head: Normocephalic and atraumatic.  Mouth/Throat: No trismus in the jaw.  Severe dental decay throughout upper and lower jaw.  Moderate swelling noted to the right side of the face with gingival tenderness on palpation but no obvious abscess available for drainage.    Eyes: EOM are normal.  Neck: Normal range of motion.  Cardiovascular: Normal rate.   Pulmonary/Chest: Effort normal.  Musculoskeletal: Normal range of motion.  Lymphadenopathy:  Anterior cervical lymphadenopathy noted.   Neurological: He is alert and oriented to person, place, and time.  Skin: Skin is warm and dry.  Psychiatric: He has a normal mood and affect. His  behavior is normal.    ED Course  Procedures (including critical care time)  DIAGNOSTIC STUDIES: Oxygen Saturation is 100% on room air, normal by my interpretation.    COORDINATION OF CARE: 3:11 PM- Discussed discharging the patient with antibiotics and the patient agreed to the treatment plan.    Labs Review Labs Reviewed - No data to display Imaging Review No results found.   EKG Interpretation None      MDM   Final diagnoses:  Periapical abscess with facial involvement   BP 125/78  Pulse 82  Temp(Src) 98.4 F (36.9 C) (Oral)  Resp 16  Wt 175 lb (79.379 kg)  SpO2 100%  I personally performed the services described in this documentation, which was scribed in my presence. The  recorded information has been reviewed and is accurate.     Fayrene Helper, PA-C 08/16/13 [phone]

## 2013-09-07 ENCOUNTER — Encounter (HOSPITAL_COMMUNITY): Payer: Self-pay | Admitting: Emergency Medicine

## 2013-09-07 ENCOUNTER — Emergency Department (HOSPITAL_COMMUNITY): Payer: Managed Care, Other (non HMO)

## 2013-09-07 ENCOUNTER — Emergency Department (HOSPITAL_COMMUNITY)
Admission: EM | Admit: 2013-09-07 | Discharge: 2013-09-07 | Disposition: A | Discharge status: Home / Self Care | Payer: Managed Care, Other (non HMO) | Attending: Emergency Medicine | Admitting: Emergency Medicine

## 2013-09-07 DIAGNOSIS — F17200 Nicotine dependence, unspecified, uncomplicated: Secondary | ICD-10-CM | POA: Insufficient documentation

## 2013-09-07 DIAGNOSIS — S99929A Unspecified injury of unspecified foot, initial encounter: Principal | ICD-10-CM

## 2013-09-07 DIAGNOSIS — Z79899 Other long term (current) drug therapy: Secondary | ICD-10-CM | POA: Insufficient documentation

## 2013-09-07 DIAGNOSIS — W010XXA Fall on same level from slipping, tripping and stumbling without subsequent striking against object, initial encounter: Secondary | ICD-10-CM | POA: Insufficient documentation

## 2013-09-07 DIAGNOSIS — S8990XA Unspecified injury of unspecified lower leg, initial encounter: Secondary | ICD-10-CM | POA: Insufficient documentation

## 2013-09-07 DIAGNOSIS — Y9389 Activity, other specified: Secondary | ICD-10-CM | POA: Insufficient documentation

## 2013-09-07 DIAGNOSIS — S99919A Unspecified injury of unspecified ankle, initial encounter: Principal | ICD-10-CM

## 2013-09-07 DIAGNOSIS — Y929 Unspecified place or not applicable: Secondary | ICD-10-CM | POA: Insufficient documentation

## 2013-09-07 DIAGNOSIS — Z7982 Long term (current) use of aspirin: Secondary | ICD-10-CM | POA: Insufficient documentation

## 2013-09-07 DIAGNOSIS — S92352A Displaced fracture of fifth metatarsal bone, left foot, initial encounter for closed fracture: Secondary | ICD-10-CM

## 2013-09-07 DIAGNOSIS — Z882 Allergy status to sulfonamides status: Secondary | ICD-10-CM | POA: Insufficient documentation

## 2013-09-07 MED ORDER — OXYCODONE-ACETAMINOPHEN 5-325 MG PO TABS: 1.0000 | ORAL_TABLET | Freq: Three times a day (TID) | ORAL | Status: DC | PRN

## 2013-09-07 MED ORDER — OXYCODONE-ACETAMINOPHEN 5-325 MG PO TABS
1.0000 | ORAL_TABLET | Freq: Once | ORAL | Status: AC
  Administered 2013-09-07: 1 via ORAL
  Filled 2013-09-07: qty 1

## 2013-09-07 NOTE — Discharge Instructions (Signed)
Metatarsal Fracture   A metatarsal fracture is a break (fracture) of one of the bones of the mid-foot (metatarsal bones). The metatarsal bones are responsible for maintaining the arch of the foot. There are three classifications of metatarsal fractures: dancer's fractures, Jones fractures, and stress fractures. A dancer's fracture is when a piece of bone is pulled off by a ligament or tendon (avulsion fracture) of the outer part of the foot (fifth metatarsal), near the joint with the ankle bones. A Jones fracture occurs in the middle of the fifth metatarsal. These fractures have limited ability to heal. A stress fracture occurs when the bone is slowly injured faster than it can repair itself. SYMPTOMS   Sharp pain, especially with standing or walking.  Tenderness, swelling, and later bruising (contusion) of the foot.  Numbness or paralysis from swelling in the foot, causing pressure on the blood vessels or nerves (uncommon). CAUSES  Fractures occur when a force is placed on the bone that is greater than it can handle. Common causes of injury include:  Direct hit (trauma) to the foot.  Twisting injury to the foot or ankle.  Landing on the foot and ankle in an improper position. RISK INCRESES WITH:  Participation in contact sports, sports that require jumping and landing, or sports in which cleats are worn and sliding occurs.  Previous foot or ankle sprains or dislocations.  Repeated injury to any joint in the foot.  Poor strength and flexibility. PREVENTION  Warm up and stretch properly before an activity.  Allow for adequate recovery between workouts.  Maintain physical fitness in:  Strength, flexibility, and endurance.  Cardiovascular fitness.  When participating in jumping or contact sports, protect joints with supportive devices, such as wrapped elastic bandages, tape, braces, or high-top athletic shoes.  Wear properly fitted and padded protective equipment. PROGNOSIS If  treated properly, metatarsal fractures usually heal well. Jones fractures have a higher risk of the bone failing to heal (nonunion). Sometimes, surgery is needed to heal Jones fractures.  RELATED COMPLICATIONS   Nonunion.  Fracture heals in a poor position (malunion).  Long-term (chronic) pain, stiffness, or swelling of the foot.  Excessive bleeding in the foot or at the dislocation site, causing pressure and injury to nerves and blood vessels (rare).  Unstable or arthritic joint following repeated injury or delayed treatment. TREATMENT  Treatment first involves the use of ice and medicine, to reduce pain and inflammation. If the bone fragments are out of alignment (displaced), then immediate realigning of the bones (reduction) is required. Fractures that cannot be realigned by hand, or where the bones protrude through the skin (open), may require surgery to hold the fracture in place with screws, pins, and plates. After the bones are in proper alignment, the foot and ankle must be restrained for 6 or more weeks. Restraint allows healing to occur. After restraint, it is important to perform strengthening and stretching exercises to help regain strength and a full range of motion. These exercises may be completed at home or with a therapist. A stiff-soled shoe and arch support (orthotic) may be required when first returning to sports. MEDICATION   If pain medicine is needed, nonsteroidal anti-inflammatory medicines (NSAIDS), or other minor pain relievers, are often advised.  Do not take pain medicine for 7 days before surgery.  Only take over-the-counter or prescription medicines for pain, fever, or discomfort as directed by your caregiver. COLD THERAPY  Cold treatment (icing) should be applied for 10 to 15 minutes every 2 to 3  hours for inflammations and pain, and immediately after activity that aggravates your symptoms. Use ice packs or ice massage. SEEK MEDICAL CARE IF:  Pain, tenderness,  or swelling gets worse, despite treatment.  You experience pain, numbness, or coldness in the foot.  Blue, gray, or dark color appears in the toenails.  You or your child has an oral temperature above 102 F (38.9 C).  You have increased pain, swelling, and redness.  You have drainage of fluids or bleeding in the affected area.  New, unexplained symptoms develop. (Drugs used in treatment may produce side effects.) Crutch Use Crutches take weight off one of your legs or feet when you stand or walk. It is important to use crutches that fit right. Your crutches fit right if:  You can fit 2 3 fingers between your armpit and the crutch.  You use your hands, not your armpits, to hold yourself up. Do not put your armpits on the crutches. This can damage the nerves in your hands and arms. Crutches should be a little below your armpits. HOW TO USE YOUR CRUTCHES Walking 1. Step with the crutches. 2. Swing the good leg a little bit in front of the crutches. Going Up Steps If there is no handrail: 1. Step up with the good leg. 2. Step up with the crutches and hurt leg. 3. Continue in this way. If there is a handrail: 1. Hold both crutches in one hand. 2. Place your free hand on the handrail. 3. Put your weight on your arms and lift your good leg to the step. 4. Bring the crutches and the hurt leg up to that step. 5. Continue in this way. Going Down Steps Be very careful, as going down stairs with crutches is very challenging. If there is no handrail: 1. Step down with the hurt leg and crutches. 2. Step down with the good leg. If there is a handrail: 1. Place your hand on the handrail. 2. Hold both crutches with your free hand. 3. Lower your hurt leg and crutch to the step below you. Make sure to keep the crutch tips in the center of the step, never on the edge. 4. Lower your good leg to that step. 5. Continue in this way. Standing Up 1. Hold the hurt leg forward. 2. Grab the  armrest with one hand and the top of the crutches with the other hand. 3. Pull yourself up to a standing position. Sitting Down 1. Hold the hurt leg forward. 2. Grab the armrest with one hand and the top of the crutches with the other hand. 3.  Lower yourself to a sitting position. GET HELP IF:  You still feel wobbly on your feet.  You develop new pain, for example in your armpits, back, shoulder, wrist, or hip.  You cannot feel a part of your body (numb).  You have tingling. GET HELP RIGHT AWAY IF: You fall. Document Released: 11/21/2007 Document Revised: 03/25/2013 Document Reviewed: 02/09/2013 [facility] Patient Information 2014 [address], [place]. Document Released: 06/04/2005 Document Revised: 08/27/2011 Document Reviewed: 09/16/2008 [facility] Patient Information 2014 [place], [place].

## 2013-09-07 NOTE — ED Notes (Signed)
Walking down steps and injured right toes/foot

## 2013-09-07 NOTE — ED Notes (Signed)
Ortho tech called for splint placement. 

## 2013-09-07 NOTE — ED Provider Notes (Signed)
CSN: [id number]     Arrival date & time 09/07/13  1452 History   This chart was scribed for non-physician practitioner Felicie Morn, NP working with Glynn Octave, MD by Valera Castle, ED scribe. This patient was seen in room TR04C/TR04C and the patient's care was started at 4:52 PM.   Chief Complaint  Patient presents with  . Foot Injury   (Consider location/radiation/quality/duration/timing/severity/associated sxs/prior Treatment) Patient is a 53 y.o. male presenting with foot injury. The history is provided by the patient. No language interpreter was used.  Foot Injury Location:  Foot and toe Time since incident: this afternoon. Foot location:  L foot Toe location:  L third toe, L fourth toe and L little toe Pain details:    Onset quality:  Sudden   Duration: this afternoon.   Timing:  Constant   Progression:  Unchanged Chronicity:  New Worsened by:  Activity and bearing weight Associated symptoms: swelling    HPI Comments: Steven Cobb is a 53 y.o. male who presents to the Emergency Department complaining of constant left foot pain, over his 3rd, 4th, and 5th, digits, onset this afternoon when he tripped while walking on steps, landing with his weight on those three toes that were bent underneath his foot. He reports associated swelling over the left side of his foot and has pain when ambulating. 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 He denies any other associated symptoms.   PCP - No PCP Per Patient  History reviewed. No pertinent past medical history. Past Surgical History  Procedure Laterality Date  . Shoulder surgery     Family History  Problem Relation Age of Onset  . Coronary artery disease Mother   . Coronary artery disease Father    History  Substance Use Topics  . Smoking status: Current Every Day Smoker -- 0.05 packs/day    Types: Cigarettes  . Smokeless tobacco: Not on file  . Alcohol Use: No    Review of Systems  Musculoskeletal:  Positive for arthralgias (left foot, 3rd, 4th, and 5th digits).  Skin: Negative for wound.  Neurological: Negative for syncope.  All other systems reviewed and are negative.   Allergies  Sulfa antibiotics  Home Medications   Current Outpatient Rx  Name  Route  Sig  Dispense  Refill  . aspirin 325 MG tablet   Oral   Take 650 mg by mouth daily.         Marland Kitchen HYDROcodone-acetaminophen (NORCO/VICODIN) 5-325 MG per tablet   Oral   Take 1 tablet by mouth every 4 (four) hours as needed for moderate pain.         Marland Kitchen ibuprofen (ADVIL,MOTRIN) 200 MG tablet   Oral   Take 400-800 mg by mouth every 6 (six) hours as needed for pain.         . phenylephrine (NEO-SYNEPHRINE) 1 % nasal spray   Nasal   Place 1-2 drops into the nose every 4 (four) hours as needed for congestion.         . penicillin v potassium (VEETID) 500 MG tablet   Oral   Take 1 tablet (500 mg total) by mouth 3 (three) times daily.   30 tablet   0    BP 100/70  Temp(Src) 97.6 F (36.4 C) (Oral)  Resp 20  Ht 5\' 8"  (1.727 m)  Wt 175 lb (79.379 kg)  BMI 26.61 kg/m2  SpO2 96%  Physical Exam  Nursing note and vitals reviewed. Constitutional: He is oriented to  person, place, and time. He appears well-developed and well-nourished. No distress.  HENT:  Head: Normocephalic and atraumatic.  Eyes: EOM are normal.  Neck: Neck supple. No tracheal deviation present.  Cardiovascular: Normal rate and intact distal pulses.   Pulmonary/Chest: Effort normal. No respiratory distress.  Musculoskeletal: Normal range of motion.  Left foot tenderness along lateral aspect. Good cap refill. DP intact.   Neurological: He is alert and oriented to person, place, and time.  Skin: Skin is warm and dry.  Psychiatric: He has a normal mood and affect. His behavior is normal.    ED Course  Procedures (including critical care time)  DIAGNOSTIC STUDIES: Oxygen Saturation is 98% on room air, normal by my interpretation.     COORDINATION OF CARE: 4:57 PM-Discussed treatment plan which includes a splint with pt at bedside and pt agreed to plan. Will give pt note for work.   Dg Foot Complete Left  09/07/2013   CLINICAL DATA:  Larey SeatFell down stairs  EXAM: LEFT FOOT - COMPLETE 3+ VIEW  COMPARISON:  None.  FINDINGS: Comminuted fracture mid shaft fifth metatarsal with mild medial displacement. No other fracture identified.  IMPRESSION: Comminuted fracture fifth metatarsal with mild displacement.   Electronically Signed   By: Marlan Palauharles  Clark M.D.   On: 09/07/2013 15:39    EKG Interpretation None     Medications - No data to display   MDM   Final diagnoses:  None    Metatarsal fracture.  Posterior splint, crutches, ortho follow-up.  I personally performed the services described in this documentation, which was scribed in my presence. The recorded information has been reviewed and is accurate.    Jimmye Normanavid John Smith, NP 09/08/13 [id number]

## 2013-09-07 NOTE — Progress Notes (Signed)
Orthopedic Tech Progress Note Patient Details:  Steven Cobb 01/07/1961 [id number]  Ortho Devices Type of Ortho Device: Ace wrap;Post (short leg) splint Ortho Device/Splint Location: lle Ortho Device/Splint Interventions: Application   Crawford, Rembert 09/07/2013, 5:39 PM

## 2013-09-07 NOTE — ED Notes (Signed)
Ortho tech at bedside for splint placement and crutches .

## 2013-09-08 NOTE — ED Provider Notes (Signed)
Medical screening examination/treatment/procedure(s) were performed by non-physician practitioner and as supervising physician I was immediately available for consultation/collaboration.   EKG Interpretation None       Glynn OctaveStephen Rancour, MD 09/08/13 1013

## 2014-09-08 ENCOUNTER — Emergency Department (HOSPITAL_COMMUNITY): Payer: Managed Care, Other (non HMO)

## 2014-09-08 ENCOUNTER — Emergency Department (HOSPITAL_COMMUNITY)
Admission: EM | Admit: 2014-09-08 | Discharge: 2014-09-08 | Disposition: A | Discharge status: Home / Self Care | Payer: Managed Care, Other (non HMO) | Attending: Emergency Medicine | Admitting: Emergency Medicine

## 2014-09-08 ENCOUNTER — Encounter (HOSPITAL_COMMUNITY): Payer: Self-pay | Admitting: Emergency Medicine

## 2014-09-08 DIAGNOSIS — Z720 Tobacco use: Secondary | ICD-10-CM | POA: Diagnosis not present

## 2014-09-08 DIAGNOSIS — R0602 Shortness of breath: Secondary | ICD-10-CM | POA: Diagnosis not present

## 2014-09-08 DIAGNOSIS — R079 Chest pain, unspecified: Secondary | ICD-10-CM | POA: Diagnosis present

## 2014-09-08 LAB — BASIC METABOLIC PANEL
Anion gap: 5 (ref 5–15)
BUN: 10 mg/dL (ref 6–23)
CO2: 24 mmol/L (ref 19–32)
Calcium: 8.5 mg/dL (ref 8.4–10.5)
Chloride: 111 mmol/L (ref 96–112)
Creatinine, Ser: 1.24 mg/dL (ref 0.50–1.35)
GFR calc Af Amer: 75 mL/min — ABNORMAL LOW (ref >90)
GFR calc non Af Amer: 64 mL/min — ABNORMAL LOW (ref >90)
Glucose, Bld: 123 mg/dL — ABNORMAL HIGH (ref 70–99)
Potassium: 3.5 mmol/L (ref 3.5–5.1)
Sodium: 140 mmol/L (ref 135–145)

## 2014-09-08 LAB — CBC
HCT: 40.2 % (ref 39.0–52.0)
Hemoglobin: 13.9 g/dL (ref 13.0–17.0)
MCH: 31.5 pg (ref 26.0–34.0)
MCHC: 34.6 g/dL (ref 30.0–36.0)
MCV: 91.2 fL (ref 78.0–100.0)
Platelets: 224 10*3/uL (ref 150–400)
RBC: 4.41 MIL/uL (ref 4.22–5.81)
RDW: 14.4 % (ref 11.5–15.5)
WBC: 8.7 10*3/uL (ref 4.0–10.5)

## 2014-09-08 LAB — I-STAT TROPONIN, ED
Troponin i, poc: 0 ng/mL (ref 0.00–0.08)
Troponin i, poc: 0 ng/mL (ref 0.00–0.08)

## 2014-09-08 LAB — D-DIMER, QUANTITATIVE: D-Dimer, Quant: <0.27 ug/mL-FEU (ref 0.00–0.48)

## 2014-09-08 MED ORDER — NITROGLYCERIN 0.4 MG SL SUBL
0.4000 mg | SUBLINGUAL_TABLET | SUBLINGUAL | Status: DC | PRN
  Administered 2014-09-08: 0.4 mg via SUBLINGUAL

## 2014-09-08 MED ORDER — ASPIRIN 325 MG PO TABS: 325.0000 mg | ORAL_TABLET | Freq: Every day | ORAL | Status: DC

## 2014-09-08 MED ORDER — ASPIRIN 81 MG PO CHEW
324.0000 mg | CHEWABLE_TABLET | Freq: Once | ORAL | Status: AC
  Administered 2014-09-08: 324 mg via ORAL
  Filled 2014-09-08: qty 4

## 2014-09-08 NOTE — ED Notes (Signed)
Patient with chest pain and shortness of breath that started at work.  Patient states that he was trying to put some equipment away and had a sharp pain in his chest.  At the time of the pain, he did have some shortness of breath.  The shortness of breath has subsided at this time.

## 2014-09-08 NOTE — ED Notes (Signed)
Pt states nitro made him chest pain free but "chest still feels tight as far as breathing wise"

## 2014-09-08 NOTE — ED Notes (Signed)
Pt verbalized understanding of d/c instructions and has no further questions.  

## 2014-09-08 NOTE — Discharge Instructions (Signed)
If you have not heard from the cardiologist in the next one to 2 days, call and make an appointment to be seen this week for further testing. Return immediately for worsening chest pain, difficulty breathing or for any concerns. Take a full 325 mg aspirin daily.  Chest Pain (Nonspecific) It is often hard to give a specific diagnosis for the cause of chest pain. There is always a chance that your pain could be related to something serious, such as a heart attack or a blood clot in the lungs. You need to follow up with your health care provider for further evaluation. CAUSES   Heartburn.  Pneumonia or bronchitis.  Anxiety or stress.  Inflammation around your heart (pericarditis) or lung (pleuritis or pleurisy).  A blood clot in the lung.  A collapsed lung (pneumothorax). It can develop suddenly on its own (spontaneous pneumothorax) or from trauma to the chest.  Shingles infection (herpes zoster virus). The chest wall is composed of bones, muscles, and cartilage. Any of these can be the source of the pain.  The bones can be bruised by injury.  The muscles or cartilage can be strained by coughing or overwork.  The cartilage can be affected by inflammation and become sore (costochondritis). DIAGNOSIS  Lab tests or other studies may be needed to find the cause of your pain. Your health care provider may have you take a test called an ambulatory electrocardiogram (ECG). An ECG records your heartbeat patterns over a 24-hour period. You may also have other tests, such as:  Transthoracic echocardiogram (TTE). During echocardiography, sound waves are used to evaluate how blood flows through your heart.  Transesophageal echocardiogram (TEE).  Cardiac monitoring. This allows your health care provider to monitor your heart rate and rhythm in real time.  Holter monitor. This is a portable device that records your heartbeat and can help diagnose heart arrhythmias. It allows your health care  provider to track your heart activity for several days, if needed.  Stress tests by exercise or by giving medicine that makes the heart beat faster. TREATMENT   Treatment depends on what may be causing your chest pain. Treatment may include:  Acid blockers for heartburn.  Anti-inflammatory medicine.  Pain medicine for inflammatory conditions.  Antibiotics if an infection is present.  You may be advised to change lifestyle habits. This includes stopping smoking and avoiding alcohol, caffeine, and chocolate.  You may be advised to keep your head raised (elevated) when sleeping. This reduces the chance of acid going backward from your stomach into your esophagus. Most of the time, nonspecific chest pain will improve within 2-3 days with rest and mild pain medicine.  HOME CARE INSTRUCTIONS   If antibiotics were prescribed, take them as directed. Finish them even if you start to feel better.  For the next few days, avoid physical activities that bring on chest pain. Continue physical activities as directed.  Do not use any tobacco products, including cigarettes, chewing tobacco, or electronic cigarettes.  Avoid drinking alcohol.  Only take medicine as directed by your health care provider.  Follow your health care provider's suggestions for further testing if your chest pain does not go away.  Keep any follow-up appointments you made. If you do not go to an appointment, you could develop lasting (chronic) problems with pain. If there is any problem keeping an appointment, call to reschedule. SEEK MEDICAL CARE IF:   Your chest pain does not go away, even after treatment.  You have a rash with  blisters on your chest.  You have a fever. SEEK IMMEDIATE MEDICAL CARE IF:   You have increased chest pain or pain that spreads to your arm, neck, jaw, back, or abdomen.  You have shortness of breath.  You have an increasing cough, or you cough up blood.  You have severe back or  abdominal pain.  You feel nauseous or vomit.  You have severe weakness.  You faint.  You have chills. This is an emergency. Do not wait to see if the pain will go away. Get medical help at once. Call your local emergency services (911 in [place] Do not drive yourself to the hospital. MAKE SURE YOU:   Understand these instructions.  Will watch your condition.  Will get help right away if you are not doing well or get worse. Document Released: 03/14/2005 Document Revised: 06/09/2013 Document Reviewed: 01/08/2008 [facility] Patient Information 2015 [employer], [place]. This information is not intended to replace advice given to you by your health care provider. Make sure you discuss any questions you have with your health care provider.

## 2014-09-08 NOTE — ED Provider Notes (Signed)
CSN: [id number]     Arrival date & time 09/08/14  0118 History  This chart was scribed for Loren Racer, MD by Tanda Rockers, ED Scribe. This patient was seen in room D31C/D31C and the patient's care was started at 1:53 AM.    Chief Complaint  Patient presents with  . Chest Pain   The history is provided by the patient. No language interpreter was used.    HPI Comments: Steven Cobb is a 54 y.o. male who presents to the Emergency Department complaining of left sided chest pain that began approximately 1 hour ago at work. Pt states that the pain came on suddenly. He describes the pain as a sharp, stabbing sensation. Pt reports that prior to the chest pain beginning, he was having shortness of breath. He states that the pain is exacerbated upon deep breathing. The pain is not exacerbated with movement. There is no change with sitting up or lying down. He states that he has never had this type of pain in the past. Denies new cough, painful leg swelling, recent prolonged travel, nausea, vomiting, or any other symptoms. Pt admits to cigarette use for the last 30 years. Pt mentions that he has FHx of MI. Pt's father passed away when he was 65. Pt's mother was 62. Pt's eldest brother was 42 and his other brother was 39. Pt has never seen a cardiologist in the past.     History reviewed. No pertinent past medical history. Past Surgical History  Procedure Laterality Date  . Shoulder surgery     Family History  Problem Relation Age of Onset  . Coronary artery disease Mother   . Coronary artery disease Father    History  Substance Use Topics  . Smoking status: Current Every Day Smoker -- 0.05 packs/day    Types: Cigarettes  . Smokeless tobacco: Not on file  . Alcohol Use: No    Review of Systems  Constitutional: Negative for fever and chills.  Respiratory: Positive for shortness of breath. Negative for cough and chest tightness.   Cardiovascular: Positive for chest pain. Negative for  leg swelling.  Gastrointestinal: Negative for nausea, vomiting and abdominal pain.  Musculoskeletal: Negative for myalgias, back pain, neck pain and neck stiffness.  Skin: Negative for rash and wound.  Neurological: Negative for dizziness, weakness, light-headedness, numbness and headaches.  All other systems reviewed and are negative.     Allergies  Sulfa antibiotics  Home Medications   Prior to Admission medications   Medication Sig Start Date End Date Taking? Authorizing Provider  ibuprofen (ADVIL,MOTRIN) 200 MG tablet Take 400-800 mg by mouth every 6 (six) hours as needed for pain.   Yes Historical Provider, MD  Multiple Vitamin (MULTIVITAMIN WITH MINERALS) TABS tablet Take 1 tablet by mouth daily.   Yes Historical Provider, MD  phenylephrine (NEO-SYNEPHRINE) 1 % nasal spray Place 1-2 drops into the nose every 4 (four) hours as needed for congestion.   Yes Historical Provider, MD  aspirin 325 MG tablet Take 1 tablet (325 mg total) by mouth daily. 09/08/14   Loren Racer, MD  HYDROcodone-acetaminophen (NORCO/VICODIN) 5-325 MG per tablet Take 1 tablet by mouth every 4 (four) hours as needed for moderate pain.    Historical Provider, MD  oxyCODONE-acetaminophen (PERCOCET/ROXICET) 5-325 MG per tablet Take 1 tablet by mouth every 8 (eight) hours as needed for severe pain. Patient not taking: Reported on 09/08/2014 09/07/13   Felicie Morn, NP  penicillin v potassium (VEETID) 500 MG tablet Take 1 tablet (  500 mg total) by mouth 3 (three) times daily. Patient not taking: Reported on 09/08/2014 08/16/13   Fayrene Helper, PA-C   Triage Vitals: BP 126/72 mmHg  Pulse 85  Temp(Src) 98.3 F (36.8 C) (Oral)  Resp 18  Ht  (1.702 m)  Wt 171 lb (77.565 kg)  BMI 26.78 kg/m2  SpO2 97%   Physical Exam  Constitutional: He is oriented to person, place, and time. He appears well-developed and well-nourished. No distress.  HENT:  Head: Normocephalic and atraumatic.  Mouth/Throat: Oropharynx is clear  and moist.  Eyes: EOM are normal. Pupils are equal, round, and reactive to light.  Neck: Normal range of motion. Neck supple.  Cardiovascular: Normal rate and regular rhythm.  Exam reveals no gallop and no friction rub.   No murmur heard. Pulmonary/Chest: Effort normal and breath sounds normal. No respiratory distress. He has no wheezes. He has no rales. He exhibits no tenderness.  Abdominal: Soft. Bowel sounds are normal. He exhibits no distension and no mass. There is no tenderness. There is no rebound and no guarding.  Musculoskeletal: Normal range of motion. He exhibits no edema or tenderness.  No calf swelling or tenderness.  Neurological: He is alert and oriented to person, place, and time.  Moves all extremities without deficit. Sensation is grossly intact.  Skin: Skin is warm and dry. No rash noted. No erythema.  Psychiatric: He has a normal mood and affect. His behavior is normal.  Nursing note and vitals reviewed.   ED Course  Procedures (including critical care time)  DIAGNOSTIC STUDIES: Oxygen Saturation is 97% on RA, normal by my interpretation.    COORDINATION OF CARE: 1:58 AM-Discussed treatment plan which includes CXR, Troponin, CBC, D-dimer, BMP with pt at bedside and pt agreed to plan.   Labs Review Labs Reviewed  BASIC METABOLIC PANEL - Abnormal; Notable for the following:    Glucose, Bld 123 (*)    GFR calc non Af Amer 64 (*)    GFR calc Af Amer 75 (*)    All other components within normal limits  CBC  D-DIMER, QUANTITATIVE  I-STAT TROPOININ, ED  Rosezena Sensor, ED    Imaging Review Dg Chest 2 View  09/08/2014   CLINICAL DATA:  Left-sided chest pain, shortness of breath. Symptoms for 1 night.  EXAM: CHEST  2 VIEW  COMPARISON:  11/07/2012  FINDINGS: The cardiomediastinal contours are normal. The lungs are clear. Pulmonary vasculature is normal. No consolidation, pleural effusion, or pneumothorax. No acute osseous abnormalities are seen.  IMPRESSION: No  acute pulmonary process.   Electronically Signed   By: Rubye Oaks M.D.   On: 09/08/2014 02:12     EKG Interpretation   Date/Time:  Wednesday September 08 2014 01:23:35 EDT Ventricular Rate:  84 PR Interval:  152 QRS Duration: 90 QT Interval:  380 QTC Calculation: 449 R Axis:   0 Text Interpretation:  Normal sinus rhythm Normal ECG Confirmed by  Ranae Palms  MD, DAVID ([id number]) on 09/08/2014 1:49:12 AM      MDM   Final diagnoses:  Chest pain, unspecified chest pain type    I personally performed the services described in this documentation, which was scribed in my presence. The recorded information has been reviewed and is accurate.  Patient is currently chest pain-free. Has significant risk factors for coronary artery disease. Advised admission for rule out and possible inpatient provocative testing. She is refusing admission. He understands the risks associated with leaving the emergency department. States he will say  for repeat troponin. Will discuss with cardiology to try to arrange follow-up as an outpatient. Patient is aware that he needs to return immediately for any worsening symptoms or any concerns. Repeat trop is normal  Discussed this with Dr. Mayford Knifeurner who took the patient's information. Will arrange outpatient follow-up.  Loren Raceravid Yelverton, MD 09/08/14 [phone]

## 2015-08-06 ENCOUNTER — Encounter (HOSPITAL_COMMUNITY): Payer: Self-pay

## 2015-08-06 ENCOUNTER — Emergency Department (HOSPITAL_COMMUNITY): Payer: Managed Care, Other (non HMO)

## 2015-08-06 ENCOUNTER — Emergency Department (HOSPITAL_COMMUNITY)
Admission: EM | Admit: 2015-08-06 | Discharge: 2015-08-06 | Disposition: A | Discharge status: Home / Self Care | Payer: Managed Care, Other (non HMO) | Attending: Emergency Medicine | Admitting: Emergency Medicine

## 2015-08-06 DIAGNOSIS — Y9389 Activity, other specified: Secondary | ICD-10-CM | POA: Insufficient documentation

## 2015-08-06 DIAGNOSIS — S0990XA Unspecified injury of head, initial encounter: Secondary | ICD-10-CM | POA: Diagnosis present

## 2015-08-06 DIAGNOSIS — R519 Headache, unspecified: Secondary | ICD-10-CM

## 2015-08-06 DIAGNOSIS — Z7982 Long term (current) use of aspirin: Secondary | ICD-10-CM | POA: Insufficient documentation

## 2015-08-06 DIAGNOSIS — F0781 Postconcussional syndrome: Secondary | ICD-10-CM

## 2015-08-06 DIAGNOSIS — Y990 Civilian activity done for income or pay: Secondary | ICD-10-CM | POA: Diagnosis not present

## 2015-08-06 DIAGNOSIS — F17210 Nicotine dependence, cigarettes, uncomplicated: Secondary | ICD-10-CM | POA: Diagnosis not present

## 2015-08-06 DIAGNOSIS — W208XXA Other cause of strike by thrown, projected or falling object, initial encounter: Secondary | ICD-10-CM | POA: Insufficient documentation

## 2015-08-06 DIAGNOSIS — R51 Headache: Secondary | ICD-10-CM

## 2015-08-06 DIAGNOSIS — Y9289 Other specified places as the place of occurrence of the external cause: Secondary | ICD-10-CM | POA: Insufficient documentation

## 2015-08-06 DIAGNOSIS — S0003XA Contusion of scalp, initial encounter: Secondary | ICD-10-CM

## 2015-08-06 MED ORDER — IBUPROFEN 200 MG PO TABS
600.0000 mg | ORAL_TABLET | Freq: Once | ORAL | Status: AC
Start: 2015-08-06 — End: 2015-08-06
  Administered 2015-08-06: 600 mg via ORAL
  Filled 2015-08-06: qty 3

## 2015-08-06 NOTE — Discharge Instructions (Signed)
Continue to take tylenol and motrin as needed for pain control. Return without fail for worsening symptoms, including confusion, vomiting and unable to keep down food/fluids, worsening pain, inability to walk, or any other symptoms concerning to you. Your Ct head was negative today for any serious injury, and your persistent headaches are likely from mild concussion.   Post-Concussion Syndrome Post-concussion syndrome describes the symptoms that can occur after a head injury. These symptoms can last from weeks to months. CAUSES  It is not clear why some head injuries cause post-concussion syndrome. It can occur whether your head injury was mild or severe and whether you were wearing head protection or not.  SIGNS AND SYMPTOMS  Memory difficulties.  Dizziness.  Headaches.  Double vision or blurry vision.  Sensitivity to light.  Hearing difficulties.  Depression.  Tiredness.  Weakness.  Difficulty with concentration.  Difficulty sleeping or staying asleep.  Vomiting.  Poor balance or instability on your feet.  Slow reaction time.  Difficulty learning and remembering things you have heard. DIAGNOSIS  There is no test to determine whether you have post-concussion syndrome. Your health care provider may order an imaging scan of your brain, such as a CT scan, to check for other problems that may be causing your symptoms (such as a severe injury inside your skull). TREATMENT  Usually, these problems disappear over time without medical care. Your health care provider may prescribe medicine to help ease your symptoms. It is important to follow up with a neurologist to evaluate your recovery and address any lingering symptoms or issues. HOME CARE INSTRUCTIONS   Take medicines only as directed by your health care provider. Do not take aspirin. Aspirin can slow blood clotting.  Sleep with your head slightly elevated to help with headaches.  Avoid any situation where there is  potential for another head injury. This includes football, hockey, soccer, basketball, martial arts, downhill snow sports, and horseback riding. Your condition will get worse every time you experience a concussion. You should avoid these activities until you are evaluated by the appropriate follow-up health care providers.  Keep all follow-up visits as directed by your health care provider. This is important. SEEK MEDICAL CARE IF:  You have increased problems paying attention or concentrating.  You have increased difficulty remembering or learning new information.  You need more time to complete tasks or assignments than before.  You have increased irritability or decreased ability to cope with stress.  You have more symptoms than before. Seek medical care if you have any of the following symptoms for more than two weeks after your injury:  Lasting (chronic) headaches.  Dizziness or balance problems.  Nausea.  Vision problems.  Increased sensitivity to noise or light.  Depression or mood swings.  Anxiety or irritability.  Memory problems.  Difficulty concentrating or paying attention.  Sleep problems.  Feeling tired all the time. SEEK IMMEDIATE MEDICAL CARE IF:  You have confusion or unusual drowsiness.  Others find it difficult to wake you up.  You have nausea or persistent, forceful vomiting.  You feel like you are moving when you are not (vertigo). Your eyes may move rapidly back and forth.  You have convulsions or faint.  You have severe, persistent headaches that are not relieved by medicine.  You cannot use your arms or legs normally.  One of your pupils is larger than the other.  You have clear or bloody discharge from your nose or ears.  Your problems are getting worse, not better.  MAKE SURE YOU:  Understand these instructions.  Will watch your condition.  Will get help right away if you are not doing well or get worse.   This information is  not intended to replace advice given to you by your health care provider. Make sure you discuss any questions you have with your health care provider.   Document Released: 11/24/2001 Document Revised: 06/25/2014 Document Reviewed: 09/09/2013 Elsevier Interactive Patient Education 2016 [employer]  General Headache Without Cause A headache is pain or discomfort felt around the head or neck area. There are many causes and types of headaches. In some cases, the cause may not be found.  HOME CARE  Managing Pain  Take over-the-counter and prescription medicines only as told by your doctor.  Lie down in a dark, quiet room when you have a headache.  If directed, apply ice to the head and neck area:  Put ice in a plastic bag.  Place a towel between your skin and the bag.  Leave the ice on for 20 minutes, 2-3 times per day.  Use a heating pad or hot shower to apply heat to the head and neck area as told by your doctor.  Keep lights dim if bright lights bother you or make your headaches worse. Eating and Drinking  Eat meals on a regular schedule.  Lessen how much alcohol you drink.  Lessen how much caffeine you drink, or stop drinking caffeine. General Instructions  Keep all follow-up visits as told by your doctor. This is important.  Keep a journal to find out if certain things bring on headaches. For example, write down:  What you eat and drink.  How much sleep you get.  Any change to your diet or medicines.  Relax by getting a massage or doing other relaxing activities.  Lessen stress.  Sit up straight. Do not tighten (tense) your muscles.  Do not use tobacco products. This includes cigarettes, chewing tobacco, or e-cigarettes. If you need help quitting, ask your doctor.  Exercise regularly as told by your doctor.  Get enough sleep. This often means 7-9 hours of sleep. GET HELP IF:  Your symptoms are not helped by medicine.  You have a headache that feels  different than the other headaches.  You feel sick to your stomach (nauseous) or you throw up (vomit).  You have a fever. GET HELP RIGHT AWAY IF:   Your headache becomes really bad.  You keep throwing up.  You have a stiff neck.  You have trouble seeing.  You have trouble speaking.  You have pain in the eye or ear.  Your muscles are weak or you lose muscle control.  You lose your balance or have trouble walking.  You feel like you will pass out (faint) or you pass out.  You have confusion.   This information is not intended to replace advice given to you by your health care provider. Make sure you discuss any questions you have with your health care provider.   Document Released: 03/13/2008 Document Revised: 02/23/2015 Document Reviewed: 09/27/2014 Elsevier Interactive Patient Education [employer]

## 2015-08-06 NOTE — ED Provider Notes (Signed)
CSN: [id number]     Arrival date & time 08/06/15  1608 History   First MD Initiated Contact with Patient 08/06/15 1635     Chief Complaint  Patient presents with  . Head Injury     (Consider location/radiation/quality/duration/timing/severity/associated sxs/prior Treatment) HPI 55 year old male who presents with headaches after head injury. Does not take blood thinners aside from aspirin. He reports that one week ago an 8 pound retaining bar fell on top of his head from a height of about 3-4 feet above. States that he does not think he had loss of consciousness, but felt immediately dazed and dizzy afterwards. Since then he has had constant daily headache not relieved by occasional ibuprofen. Noticed hematoma over the right side of his head where he was hit by the bar. Denies nausea, vomiting, confusion, double vision, numbness, weakness, or ataxia. Headache diffuse, but worse over hematoma. Associated with blurry vision, but states he at baseline has some blurry vision and is unsure if this may be worse. No other injuries.   History reviewed. No pertinent past medical history. Past Surgical History  Procedure Laterality Date  . Shoulder surgery     Family History  Problem Relation Age of Onset  . Coronary artery disease Mother   . Coronary artery disease Father    Social History  Substance Use Topics  . Smoking status: Current Every Day Smoker -- 0.05 packs/day    Types: Cigarettes  . Smokeless tobacco: None  . Alcohol Use: No    Review of Systems 10/14 systems reviewed and are negative other than those stated in the HPI    Allergies  Sulfa antibiotics  Home Medications   Prior to Admission medications   Medication Sig Start Date End Date Taking? Authorizing Provider  aspirin EC 81 MG tablet Take 81 mg by mouth daily.   Yes Historical Provider, MD  ibuprofen (ADVIL,MOTRIN) 200 MG tablet Take 400-800 mg by mouth every 6 (six) hours as needed for pain.   Yes Historical  Provider, MD  phenylephrine (NEO-SYNEPHRINE) 1 % nasal spray Place 1-2 drops into the nose every 4 (four) hours as needed for congestion.   Yes Historical Provider, MD   BP 120/82 mmHg  Pulse 84  Temp(Src) 98 F (36.7 C) (Oral)  Resp 16  SpO2 98% Physical Exam Physical Exam  Nursing note and vitals reviewed. Constitutional: Well developed, well nourished, non-toxic, and in no acute distress Head: Normocephalic. Hematoma over right scalp posterior to temple.  Ears: Bilateral TMs clear Mouth/Throat: Oropharynx is clear and moist.  Neck: Normal range of motion. Neck supple. No cervical spine tenderness.  Cardiovascular: Normal rate and regular rhythm.   Pulmonary/Chest: Effort normal and breath sounds normal.  Abdominal: Soft. There is no tenderness. There is no rebound and no guarding.  Musculoskeletal: Normal range of motion.  Neurological:  Alert, oriented to person, place, time, and situation. Memory grossly in tact. Fluent speech. No dysarthria or aphasia.  Cranial nerves: VF are full. Pupils are symmetric, and reactive to light. EOMI without nystagmus. No gaze deviation. Facial muscles symmetric with activation. Sensation to light touch over face in tact bilaterally. Hearing grossly in tact. Palate elevates symmetrically. Head turn and shoulder shrug are intact. Tongue midline.  Reflexes defered.  Muscle bulk and tone normal. No pronator drift. Moves all extremities symmetrically. Sensation to light touch is in tact throughout in bilateral upper and lower extremities. Coordination reveals no dysmetria with finger to nose. Gait is narrow-based and steady. Non-ataxic. Skin:  Skin is warm and dry.  Psychiatric: Cooperative  ED Course  Procedures (including critical care time) Labs Review Labs Reviewed - No data to display  Imaging Review Ct Head Wo Contrast  08/06/2015  CLINICAL DATA:  Head injury 4 days ago as hit by retaining bar at work. Persistent headache. Injury to  right-sided head. EXAM: CT HEAD WITHOUT CONTRAST TECHNIQUE: Contiguous axial images were obtained from the base of the skull through the vertex without intravenous contrast. COMPARISON:  None. FINDINGS: Ventricles, cisterns and other CSF spaces are within normal. There is no mass, mass effect, shift of midline structures or acute hemorrhage. No evidence of acute infarction. Possible small 1.5 cm arachnoid cyst over the left frontal region. Remaining bones and soft tissues are within normal. IMPRESSION: No acute intracranial findings. Electronically Signed   By: Elberta Fortis M.D.   On: 08/06/2015 18:00   I have personally reviewed and evaluated these images and lab results as part of my medical decision-making.   EKG Interpretation None      MDM   Final diagnoses:  Post concussive syndrome  Acute nonintractable headache, unspecified headache type  Scalp hematoma, initial encounter    55 year old male with constant daily headaches after head injury one week ago. Is well appearing on arrival. Neuro in tact. Hematoma over right side of scalp. Suspect minor concussive symptoms. Ct head negative for bleed or serious injury. Discussed continued supportive care for home for concussive symptoms. Strict return and follow-up instructions reviewed. He expressed understanding of all discharge instructions and felt comfortable with the plan of care.     Lavera Guise, MD 08/06/15 [phone]

## 2015-08-06 NOTE — ED Notes (Signed)
Pt presents with c/o head injury that occurred on Monday night. Pt reports he was hit with a retaining bar on his head at work on Monday night. Pt reports he was dizzy after the incident and has had a constant headache since then not relieved by ibuprofen. Pt reports the area where he was hit, right side of his head, is still painful and is swollen.

## 2015-08-06 NOTE — ED Notes (Signed)
Awake. Verbally responsive. A/O x4. Resp even and unlabored. No audible adventitious breath sounds noted. ABC's intact.  

## 2016-08-01 ENCOUNTER — Ambulatory Visit (HOSPITAL_COMMUNITY)
Admission: EM | Admit: 2016-08-01 | Discharge: 2016-08-01 | Disposition: A | Discharge status: Home / Self Care | Payer: Managed Care, Other (non HMO) | Attending: Family Medicine | Admitting: Family Medicine

## 2016-08-01 ENCOUNTER — Encounter (HOSPITAL_COMMUNITY): Payer: Self-pay | Admitting: Emergency Medicine

## 2016-08-01 DIAGNOSIS — M25531 Pain in right wrist: Secondary | ICD-10-CM | POA: Diagnosis not present

## 2016-08-01 DIAGNOSIS — G5601 Carpal tunnel syndrome, right upper limb: Secondary | ICD-10-CM | POA: Diagnosis not present

## 2016-08-01 DIAGNOSIS — M778 Other enthesopathies, not elsewhere classified: Secondary | ICD-10-CM

## 2016-08-01 NOTE — ED Provider Notes (Signed)
CSN: [id number]     Arrival date & time 08/01/16  1245 History   First MD Initiated Contact with Patient 08/01/16 1344     Chief Complaint  Patient presents with  . Wrist Pain   (Consider location/radiation/quality/duration/timing/severity/associated sxs/prior Treatment) 56 year old male complaining of right wrist pain. It started about 2 weeks ago. 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 Denies any known injury, blunt trauma, fall or other injury. Pain is located primarily in the wrist and off the hand.      History reviewed. No pertinent past medical history. Past Surgical History:  Procedure Laterality Date  . SHOULDER SURGERY     Family History  Problem Relation Age of Onset  . Coronary artery disease Mother   . Coronary artery disease Father    Social History  Substance Use Topics  . Smoking status: Current Every Day Smoker    Packs/day: 0.05    Types: Cigarettes  . Smokeless tobacco: Not on file  . Alcohol use No    Review of Systems  Constitutional: Negative.   Respiratory: Negative.   Gastrointestinal: Negative.   Genitourinary: Negative.   Musculoskeletal:       As per HPI  Skin: Negative.   Neurological: Negative for dizziness, weakness, numbness and headaches.  All other systems reviewed and are negative.   Allergies  Sulfa antibiotics  Home Medications   Prior to Admission medications   Medication Sig Start Date End Date Taking? Authorizing Provider  ibuprofen (ADVIL,MOTRIN) 200 MG tablet Take 400-800 mg by mouth every 6 (six) hours as needed for pain.   Yes Historical Provider, MD   Meds Ordered and Administered this Visit  Medications - No data to display  BP 104/77 (BP Location: Left Arm)   Pulse 96   Temp 98.1 F (36.7 C) (Oral)   Resp 16   SpO2 100%  No data found.   Physical Exam  Constitutional: He is oriented to person, place, and time. He appears  well-developed and well-nourished.  HENT:  Head: Normocephalic and atraumatic.  Eyes: EOM are normal. Left eye exhibits no discharge.  Neck: Normal range of motion. Neck supple.  Musculoskeletal:  Soreness to the volar and extensor surface of the right wrist. No deformity or appreciable swelling. Positive Tinel's. Positive Phalen's. Range of motion with extension and flexion intact but with some pain. Distal neurovascular motor sensory is grossly intact.  Neurological: He is alert and oriented to person, place, and time. No cranial nerve deficit.  Skin: Skin is warm and dry.  Psychiatric: He has a normal mood and affect.    Urgent Care Course     Procedures (including critical care time)  Labs Review Labs Reviewed - No data to display  Imaging Review No results found.   Visual Acuity Review  Right Eye Distance:   Left Eye Distance:   Bilateral Distance:    Right Eye Near:   Left Eye Near:    Bilateral Near:         MDM   1. Right wrist pain   2. Right wrist tendinitis   3. Carpal tunnel syndrome of right wrist    Wear the wrist for the first 7-14 days. May remove it periodically and move the wrist around to keep her from getting stiff. Wear it at all times during work. Apply ice to the wrist off and on. He may take ibuprofen every 6 hours  as needed. If not getting better in 2 weeks call the hand surgeon listed on page one.     Hayden Rasmussen, NP 08/01/16 1356

## 2016-08-01 NOTE — ED Triage Notes (Signed)
[facility] a [facility] right wrist pain x 2 weeks. The patient denied any known injury but did report repetitive motion at his job.

## 2016-08-01 NOTE — Discharge Instructions (Signed)
Wear the wrist for the first 7-14 days. May remove it periodically and move the wrist around to keep her from getting stiff. Wear it at all times during work. Apply ice to the wrist off and on. He may take ibuprofen every 6 hours as needed. If not getting better in 2 weeks call the hand surgeon listed on page one.

## 2016-12-20 ENCOUNTER — Encounter (HOSPITAL_COMMUNITY): Payer: Self-pay | Admitting: Emergency Medicine

## 2016-12-20 ENCOUNTER — Emergency Department (HOSPITAL_COMMUNITY): Payer: PRIVATE HEALTH INSURANCE

## 2016-12-20 DIAGNOSIS — R2232 Localized swelling, mass and lump, left upper limb: Secondary | ICD-10-CM | POA: Diagnosis present

## 2016-12-20 DIAGNOSIS — Z79899 Other long term (current) drug therapy: Secondary | ICD-10-CM | POA: Diagnosis not present

## 2016-12-20 DIAGNOSIS — L03114 Cellulitis of left upper limb: Secondary | ICD-10-CM | POA: Diagnosis not present

## 2016-12-20 DIAGNOSIS — W540XXA Bitten by dog, initial encounter: Secondary | ICD-10-CM | POA: Diagnosis not present

## 2016-12-20 DIAGNOSIS — F17210 Nicotine dependence, cigarettes, uncomplicated: Secondary | ICD-10-CM | POA: Insufficient documentation

## 2016-12-20 NOTE — ED Triage Notes (Signed)
Patient reports left elbow swelling with drainage from a dog bite last Sunday , pt. is unnsure if dog's immunizations are up to date , denies fever or chills .

## 2016-12-21 ENCOUNTER — Emergency Department (HOSPITAL_COMMUNITY)
Admission: EM | Admit: 2016-12-21 | Discharge: 2016-12-21 | Disposition: A | Discharge status: Home / Self Care | Payer: PRIVATE HEALTH INSURANCE | Attending: Emergency Medicine | Admitting: Emergency Medicine

## 2016-12-21 DIAGNOSIS — W540XXA Bitten by dog, initial encounter: Secondary | ICD-10-CM

## 2016-12-21 DIAGNOSIS — L03114 Cellulitis of left upper limb: Secondary | ICD-10-CM

## 2016-12-21 LAB — CBC WITH DIFFERENTIAL/PLATELET
Basophils Absolute: 0 10*3/uL (ref 0.0–0.1)
Basophils Relative: 0 %
Eosinophils Absolute: 0.2 10*3/uL (ref 0.0–0.7)
Eosinophils Relative: 3 %
HCT: 42.6 % (ref 39.0–52.0)
Hemoglobin: 14.1 g/dL (ref 13.0–17.0)
Lymphocytes Relative: 43 %
Lymphs Abs: 3.2 10*3/uL (ref 0.7–4.0)
MCH: 30.5 pg (ref 26.0–34.0)
MCHC: 33.1 g/dL (ref 30.0–36.0)
MCV: 92 fL (ref 78.0–100.0)
Monocytes Absolute: 0.7 10*3/uL (ref 0.1–1.0)
Monocytes Relative: 10 %
Neutro Abs: 3.4 10*3/uL (ref 1.7–7.7)
Neutrophils Relative %: 44 %
Platelets: 197 10*3/uL (ref 150–400)
RBC: 4.63 MIL/uL (ref 4.22–5.81)
RDW: 15 % (ref 11.5–15.5)
WBC: 7.6 10*3/uL (ref 4.0–10.5)

## 2016-12-21 LAB — BASIC METABOLIC PANEL
Anion gap: 7 (ref 5–15)
BUN: 9 mg/dL (ref 6–20)
CO2: 22 mmol/L (ref 22–32)
Calcium: 8.6 mg/dL — ABNORMAL LOW (ref 8.9–10.3)
Chloride: 109 mmol/L (ref 101–111)
Creatinine, Ser: 1.15 mg/dL (ref 0.61–1.24)
GFR calc Af Amer: >60 mL/min (ref >60)
GFR calc non Af Amer: >60 mL/min (ref >60)
Glucose, Bld: 92 mg/dL (ref 65–99)
Potassium: 3.7 mmol/L (ref 3.5–5.1)
Sodium: 138 mmol/L (ref 135–145)

## 2016-12-21 MED ORDER — AMOXICILLIN-POT CLAVULANATE 875-125 MG PO TABS
1.0000 | ORAL_TABLET | Freq: Two times a day (BID) | ORAL | 0 refills | Status: DC
Start: 2016-12-21 — End: 2018-06-19

## 2016-12-21 MED ORDER — SODIUM CHLORIDE 0.9 % IV SOLN
3.0000 g | Freq: Once | INTRAVENOUS | Status: AC
  Administered 2016-12-21: 3 g via INTRAVENOUS
  Filled 2016-12-21: qty 3

## 2016-12-21 NOTE — ED Provider Notes (Addendum)
[facility]-EMERGENCY DEPT Provider Note   CSN: [id number] Arrival date & time: 12/20/16  2145  By signing my name below, I, Ny'Kea Lewis, attest that this documentation has been prepared under the direction and in the presence of Pollina, Canary Brimhristopher J, *. Electronically Signed: Karren CobbleNy'Kea Lewis, ED Scribe. 12/21/16. 1:28 AM.   History   Chief Complaint Chief Complaint  Patient presents with  . Dog Bite    Left Elbow   HPI  HPI Comments: Steven Cobb is a 56 y.o. male with no pertinent history, who presents to the Emergency Department complaining of gradually worsening pain and swelling of area of dog bite to his left elbow that occurred five days ago. Pt reports he was bitten by his neighbor's dog, animal control has quarantined the dog.   History reviewed. No pertinent past medical history.  There are no active problems to display for this patient.   Past Surgical History:  Procedure Laterality Date  . SHOULDER SURGERY      Home Medications    Prior to Admission medications   Medication Sig Start Date End Date Taking? Authorizing Provider  aspirin EC 81 MG tablet Take 81 mg by mouth daily.   Yes [provider]  ibuprofen (ADVIL,MOTRIN) 200 MG tablet Take 400-800 mg by mouth every 6 (six) hours as needed for pain.   Yes [provider]    Family History Family History  Problem Relation Age of Onset  . Coronary artery disease Mother   . Coronary artery disease Father     Social History Social History  Substance Use Topics  . Smoking status: Current Every Day Smoker    Packs/day: 0.05    Types: Cigarettes  . Smokeless tobacco: Never Used  . Alcohol use No     Allergies   Sulfa antibiotics   Review of Systems Review of Systems   Physical Exam Updated Vital Signs BP 118/66 (BP Location: Right Arm)   Pulse 84   Temp 98.3 F (36.8 C) (Oral)   Resp 16   Ht 5\' 8"  (1.727 m)   Wt 78.5 kg (173 lb)   SpO2 99%   BMI 26.30 kg/m   Physical  Exam  Constitutional: He is oriented to person, place, and time. He appears well-developed and well-nourished. No distress.  HENT:  Head: Normocephalic and atraumatic.  Right Ear: Hearing normal.  Left Ear: Hearing normal.  Nose: Nose normal.  Mouth/Throat: Oropharynx is clear and moist and mucous membranes are normal.  Eyes: Conjunctivae and EOM are normal. Pupils are equal, round, and reactive to light.  Neck: Normal range of motion. Neck supple.  Cardiovascular: Regular rhythm, S1 normal and S2 normal.  Exam reveals no gallop and no friction rub.   No murmur heard. Pulmonary/Chest: Effort normal and breath sounds normal. No respiratory distress. He exhibits no tenderness.  Abdominal: Soft. Normal appearance and bowel sounds are normal. There is no hepatosplenomegaly. There is no tenderness. There is no rebound, no guarding, no tenderness at McBurney's point and negative Murphy's sign. No hernia.  Musculoskeletal: Normal range of motion.  .4 x 1 cm open wound on the lateral aspect of the distal humerus/elbow area with slight swelling, but no drainage, erythema, or induration. No tenderness over joint margin. Normal ROM without pain noted.   Neurological: He is alert and oriented to person, place, and time. He has normal strength. No cranial nerve deficit or sensory deficit. Coordination normal. GCS eye subscore is 4. GCS verbal subscore is 5. GCS motor  subscore is 6.  Skin: Skin is warm, dry and intact. No rash noted. No cyanosis.  Psychiatric: He has a normal mood and affect. His speech is normal and behavior is normal. Thought content normal.  Nursing note and vitals reviewed.   ED Treatments / Results  DIAGNOSTIC STUDIES: Oxygen Saturation is 99% on RA, normal by my interpretation.   COORDINATION OF CARE: 12:56 AM-Discussed next steps with pt. Pt verbalized understanding and is agreeable with the plan.   Labs (all labs ordered are listed, but only abnormal results are  displayed) Labs Reviewed  CBC WITH DIFFERENTIAL/PLATELET  BASIC METABOLIC PANEL    EKG  EKG Interpretation None       Radiology Dg Elbow Complete Left  Result Date: 12/20/2016 CLINICAL DATA:  Dog bite to the posterior elbow 5 days prior with pain and swelling. Increased redness and swelling. EXAM: LEFT ELBOW - COMPLETE 3+ VIEW COMPARISON:  None. FINDINGS: There is no evidence of fracture, dislocation, or joint effusion. There is no evidence of arthropathy or other focal bone abnormality. Soft tissue edema about the posteromedial elbow no radiopaque foreign body. IMPRESSION: Soft tissue edema. No osseous abnormality or radiopaque foreign body. Electronically Signed   By: Rubye Oaks M.D.   On: 12/20/2016 22:13    Procedures Procedures (including critical care time)  Medications Ordered in ED Medications  Ampicillin-Sulbactam (UNASYN) 3 g in sodium chloride 0.9 % 100 mL IVPB (not administered)     Initial Impression / Assessment and Plan / ED Course  I have reviewed the triage vital signs and the nursing notes.  Pertinent labs & imaging results that were available during my care of the patient were reviewed by me and considered in my medical decision making (see chart for details).     Patient presents to the emergency department for evaluation of dog bite. Patient reports that the bite occurred 4 days ago. He has started to notice some increasing pain and swelling around the area of the bite. No significant drainage. Examination does reveal swelling with an open wound, no periodic drainage. Suspect early infection. No evidence of joint infiltration. X-ray unremarkable. Treat with Unasyn here in the ER and outpatient Augmentin, return if it worsens.  Patient reports abnormal control has taken the dog for 10 day course 19. Does not require rabies immunization at this time.  Final Clinical Impressions(s) / ED Diagnoses   Final diagnoses:  Dog bite, initial encounter   Cellulitis of left upper extremity    New Prescriptions New Prescriptions   No medications on file  I personally performed the services described in this documentation, which was scribed in my presence. The recorded information has been reviewed and is accurate.     Gilda Crease, MD 12/21/16 0128    Gilda Crease, MD 12/21/16 [phone]

## 2017-01-28 ENCOUNTER — Encounter (HOSPITAL_COMMUNITY): Payer: Self-pay | Admitting: Emergency Medicine

## 2017-01-28 ENCOUNTER — Emergency Department (HOSPITAL_COMMUNITY)
Admission: EM | Admit: 2017-01-28 | Discharge: 2017-01-28 | Disposition: A | Discharge status: Home / Self Care | Payer: Managed Care, Other (non HMO) | Attending: Emergency Medicine | Admitting: Emergency Medicine

## 2017-01-28 DIAGNOSIS — M7022 Olecranon bursitis, left elbow: Secondary | ICD-10-CM | POA: Insufficient documentation

## 2017-01-28 DIAGNOSIS — Y939 Activity, unspecified: Secondary | ICD-10-CM | POA: Diagnosis not present

## 2017-01-28 DIAGNOSIS — F17210 Nicotine dependence, cigarettes, uncomplicated: Secondary | ICD-10-CM | POA: Insufficient documentation

## 2017-01-28 DIAGNOSIS — Z7982 Long term (current) use of aspirin: Secondary | ICD-10-CM | POA: Insufficient documentation

## 2017-01-28 DIAGNOSIS — M25522 Pain in left elbow: Secondary | ICD-10-CM | POA: Diagnosis present

## 2017-01-28 MED ORDER — INDOMETHACIN 50 MG PO CAPS: 50.0000 mg | ORAL_CAPSULE | Freq: Two times a day (BID) | ORAL | 0 refills | Status: DC

## 2017-01-28 NOTE — ED Triage Notes (Signed)
Pt states that he was bitten by a dog on his L elbow 1 month ago and took the antibiotics but it has swollen up again. Alert and oriented.

## 2017-01-28 NOTE — Discharge Instructions (Signed)
Take the medication as needed for pain and inflammation. Follow up with Dr. Mina MarbleWeingold if symptoms persist. Return here as needed.

## 2017-01-28 NOTE — ED Notes (Signed)
NP at bedside.

## 2017-01-28 NOTE — ED Notes (Signed)
Pt ambulatory and independent at discharge.  Verbalized understanding of discharge instructions 

## 2017-01-28 NOTE — ED Provider Notes (Signed)
[facility]-EMERGENCY DEPT Provider Note   CSN: [id number] Arrival date & time: 01/28/17  1446  By signing my name below, I, Steven Cobb, attest that this documentation has been prepared under the direction and in the presence of Steven BuffaloHope Neese, NP Electronically Signed: Deland PrettySherilynn Cobb, ED Scribe. 01/28/17. 5:23 PM.  History   Chief Complaint Chief Complaint  Patient presents with  . Animal Bite   The history is provided by the patient. No language interpreter was used.  Animal Bite  Contact animal:  Dog Location:  Shoulder/arm Shoulder/arm injury location:  L elbow Time since incident:  1 month Pain details:    Quality:  Aching and tingling   Severity:  Moderate   Timing:  Constant Associated symptoms: no fever    HPI Comments: Steven Cobb is a 56 y.o. male who presents to the Emergency Department complaining of a sudden onset of "tingling" "sore" left elbow pain with associated joint swelling s/p a dog bite that occurred one month ago. The pt was seen on 12/21/2016 for the initial encounter where he was prescribed ampicillin-sulbactam. He believes his symptoms returned PTA a few days ago. Leaning on his elbow, and bearing weight exacerbates his pain. The pt states that his symptoms before the treatment are similar to those he's presenting with today. 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  History reviewed. No pertinent past medical history.  There are no active problems to display for this patient.   Past Surgical History:  Procedure Laterality Date  . SHOULDER SURGERY         Home Medications    Prior to Admission medications   Medication Sig Start Date End Date Taking? Authorizing Provider  amoxicillin-clavulanate (AUGMENTIN) 875-125 MG tablet Take 1 tablet by mouth 2 (two) times daily. 12/21/16   Gilda CreasePollina, Christopher J, MD  aspirin EC 81 MG tablet Take 81 mg by mouth daily.    [provider]  ibuprofen  (ADVIL,MOTRIN) 200 MG tablet Take 400-800 mg by mouth every 6 (six) hours as needed for pain.    [provider]  indomethacin (INDOCIN) 50 MG capsule Take 1 capsule (50 mg total) by mouth 2 (two) times daily with a meal. 01/28/17   Janne NapoleonNeese, Hope M, NP    Family History Family History  Problem Relation Age of Onset  . Coronary artery disease Mother   . Coronary artery disease Father     Social History Social History  Substance Use Topics  . Smoking status: Current Every Day Smoker    Packs/day: 0.05    Types: Cigarettes  . Smokeless tobacco: Never Used  . Alcohol use No     Allergies   Sulfa antibiotics   Review of Systems Review of Systems  Constitutional: Negative for fever.  HENT: Negative.   Cardiovascular: Negative for chest pain.  Gastrointestinal: Negative for nausea.  Musculoskeletal: Positive for arthralgias and joint swelling.  Skin: Negative for wound.  Neurological: Negative for headaches.     Physical Exam Updated Vital Signs BP 133/82 (BP Location: Right Arm)   Pulse 88   Temp 98.4 F (36.9 C) (Oral)   Resp 16   SpO2 99%   Physical Exam  Constitutional: He is oriented to person, place, and time. He appears well-developed and well-nourished. No distress.  HENT:  Head: Normocephalic and atraumatic.  Eyes: EOM are normal.  Neck: Normal range of motion.  Cardiovascular: Normal rate.   Pulmonary/Chest: Effort normal.  Musculoskeletal:  He exhibits tenderness.       Left elbow: He exhibits swelling. He exhibits normal range of motion, no deformity and no laceration. Tenderness found. Olecranon process tenderness noted.  Fluid and swelling in the olecranon compartment of the left elbow. Radial pulse 2+, adequate circulation.  Neurological: He is alert and oriented to person, place, and time.  Skin: Skin is warm and dry.  Psychiatric: He has a normal mood and affect. Judgment normal.  Nursing note and vitals reviewed.    ED Treatments /  Results   DIAGNOSTIC STUDIES: Oxygen Saturation is 100% on RA, normal by my interpretation.   COORDINATION OF CARE: 5:11 PM-Discussed next steps with pt. Pt verbalized understanding and is agreeable with the plan.   Labs (all labs ordered are listed, but only abnormal results are displayed) Labs Reviewed - No data to display  Radiology No results found.  Procedures Procedures (including critical care time)  Medications Ordered in ED Medications - No data to display   Initial Impression / Assessment and Plan / ED Course  I have reviewed the triage vital signs and the nursing notes.  Pertinent labs & imaging results that were available during my care of the patient were reviewed by me and considered in my medical decision making (see chart for details).    5:20- Was seen by Dr. Denton Lank who agrees that at this time antibiotic is not indicated.will treat for bursitis. Patient agrees with plan.   Final Clinical Impressions(s) / ED Diagnoses  56 y.o. male with tenderness and swelling to the left elbow stable for d/c without focal neuro deficits and no signs of infection. Will treat for bursitis and patient to f/u with his PCP or ortho if symptoms worsen. Final diagnoses:  Olecranon bursitis of left elbow    New Prescriptions Discharge Medication List as of 01/28/2017  5:24 PM    START taking these medications   Details  indomethacin (INDOCIN) 50 MG capsule Take 1 capsule (50 mg total) by mouth 2 (two) times daily with a meal., Starting Mon 01/28/2017, Print      I personally performed the services described in this documentation, which was scribed in my presence. The recorded information has been reviewed and is accurate.    Steven Buffalo [place], [place] 01/28/17 2247    Cathren Laine, MD 02/01/17 1049

## 2018-04-09 ENCOUNTER — Emergency Department (HOSPITAL_COMMUNITY)
Admission: EM | Admit: 2018-04-09 | Discharge: 2018-04-09 | Discharge status: Against Medical Advice | Payer: Managed Care, Other (non HMO) | Attending: Emergency Medicine | Admitting: Emergency Medicine

## 2018-04-09 ENCOUNTER — Other Ambulatory Visit: Payer: Self-pay

## 2018-04-09 ENCOUNTER — Encounter (HOSPITAL_COMMUNITY): Payer: Self-pay | Admitting: Emergency Medicine

## 2018-04-09 DIAGNOSIS — Z5321 Procedure and treatment not carried out due to patient leaving prior to being seen by health care provider: Secondary | ICD-10-CM | POA: Diagnosis not present

## 2018-04-09 DIAGNOSIS — R51 Headache: Secondary | ICD-10-CM | POA: Insufficient documentation

## 2018-04-09 LAB — CBC
HCT: 50.2 % (ref 39.0–52.0)
Hemoglobin: 16.2 g/dL (ref 13.0–17.0)
MCH: 30.8 pg (ref 26.0–34.0)
MCHC: 32.3 g/dL (ref 30.0–36.0)
MCV: 95.4 fL (ref 80.0–100.0)
Platelets: 237 10*3/uL (ref 150–400)
RBC: 5.26 MIL/uL (ref 4.22–5.81)
RDW: 15 % (ref 11.5–15.5)
WBC: 9.1 10*3/uL (ref 4.0–10.5)
nRBC: 0 % (ref 0.0–0.2)

## 2018-04-09 LAB — BASIC METABOLIC PANEL
Anion gap: 6 (ref 5–15)
BUN: 11 mg/dL (ref 6–20)
CO2: 27 mmol/L (ref 22–32)
Calcium: 9 mg/dL (ref 8.9–10.3)
Chloride: 112 mmol/L — ABNORMAL HIGH (ref 98–111)
Creatinine, Ser: 1.27 mg/dL — ABNORMAL HIGH (ref 0.61–1.24)
GFR calc Af Amer: >60 mL/min (ref >60)
GFR calc non Af Amer: >60 mL/min (ref >60)
Glucose, Bld: 64 mg/dL — ABNORMAL LOW (ref 70–99)
Potassium: 3.5 mmol/L (ref 3.5–5.1)
Sodium: 145 mmol/L (ref 135–145)

## 2018-04-09 NOTE — ED Notes (Signed)
Patient called for room placement x3 with no answer. 

## 2018-04-09 NOTE — ED Notes (Signed)
Patient called for room placement x2 with no answer. 

## 2018-04-09 NOTE — ED Notes (Signed)
Patient called for room placement x1 with no answer. 

## 2018-04-09 NOTE — ED Triage Notes (Signed)
Pt complaint of light headedness and "starting of a headache" onset 20 minutes a go; denies weakness or numbness.

## 2018-04-22 ENCOUNTER — Ambulatory Visit: Payer: Self-pay | Attending: Family Medicine | Admitting: Family Medicine

## 2018-04-22 ENCOUNTER — Encounter: Payer: Self-pay | Admitting: Family Medicine

## 2018-04-22 ENCOUNTER — Other Ambulatory Visit: Payer: Self-pay

## 2018-04-22 VITALS — BP 132/89 | HR 83 | Temp 98.4°F | Resp 18 | Ht 68.0 in | Wt 183.0 lb

## 2018-04-22 DIAGNOSIS — Z833 Family history of diabetes mellitus: Secondary | ICD-10-CM | POA: Insufficient documentation

## 2018-04-22 DIAGNOSIS — Z79899 Other long term (current) drug therapy: Secondary | ICD-10-CM | POA: Insufficient documentation

## 2018-04-22 DIAGNOSIS — M2550 Pain in unspecified joint: Secondary | ICD-10-CM

## 2018-04-22 DIAGNOSIS — R350 Frequency of micturition: Secondary | ICD-10-CM

## 2018-04-22 DIAGNOSIS — K219 Gastro-esophageal reflux disease without esophagitis: Secondary | ICD-10-CM

## 2018-04-22 DIAGNOSIS — F17210 Nicotine dependence, cigarettes, uncomplicated: Secondary | ICD-10-CM | POA: Insufficient documentation

## 2018-04-22 DIAGNOSIS — M79671 Pain in right foot: Secondary | ICD-10-CM

## 2018-04-22 DIAGNOSIS — Z882 Allergy status to sulfonamides status: Secondary | ICD-10-CM | POA: Insufficient documentation

## 2018-04-22 LAB — POCT URINALYSIS DIP (CLINITEK)
Bilirubin, UA: NEGATIVE
Blood, UA: NEGATIVE
Glucose, UA: NEGATIVE mg/dL
Ketones, POC UA: NEGATIVE mg/dL
Leukocytes, UA: NEGATIVE
Nitrite, UA: NEGATIVE
POC,PROTEIN,UA: NEGATIVE
Spec Grav, UA: 1.015
Urobilinogen, UA: 0.2 U/dL
pH, UA: 7.5

## 2018-04-22 MED ORDER — IBUPROFEN 600 MG PO TABS: 600.0000 mg | ORAL_TABLET | Freq: Three times a day (TID) | ORAL | 0 refills | Status: DC | PRN

## 2018-04-22 MED ORDER — OMEPRAZOLE 20 MG PO CPDR: 20.0000 mg | DELAYED_RELEASE_CAPSULE | Freq: Two times a day (BID) | ORAL | 3 refills | Status: DC

## 2018-04-22 NOTE — Progress Notes (Signed)
Subjective:    Patient ID: Steven Cobb, male    DOB: 23-Dec-1960, 57 y.o.   MRN: [id number]  HPI 57 yo male new to the practice who presents with multiple complaints including chronic multiple joint pain, especially in the bilateral knees, right elbow and right foot.  Patient also with history of surgery to the right rotator cuff.  Patient also has had history of a fracture of the left foot.  Patient states that he believes that a lot of his symptoms are secondary to prior [employer] service.  Patient states that his pain always ranges between a 6 to an 8 or greater.  Patient has been taking over-the-counter ibuprofen or aspirin as needed for pain.  Patient does have some acid reflux type symptoms including occasional burning sensation in the upper abdomen as well as sensation of backwash of bad tasting liquid into his mouth and throat.      Patient also with complaint of urinary frequency and patient would like to have a PSA.  Patient denies any weakening of his urinary stream.  Patient has had no sensation of incomplete bladder emptying and no straining to initiate his urinary stream.  Patient has no family history of prostate cancer.  Patient states that his maternal and paternal grandmother and mother father as well as brother have all had hypertension and heart disease.  Patient reports that his mother also has diabetes.  Patient denies any increased thirst or blurred vision.  Patient is married patient states that he is on disability secondary to issues with his rotator cuff.  Patient does smoke approximately a third of a pack of cigarettes per day patient also drinks about 1 pint of brandy on the weekends.  Patient states that his only surgery has been a left rotator cuff repair in 2010 or 2011.  Patient is allergic to sulfa medications which caused him to have hives.  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  Patient also with complaint of having night sweats for the past 25 years.  Patient states that the cause of this is never been found but he will often wake up with his pillow and sheets drenched with sweat.  Patient did travel internationally when he was younger as well as when he was in the [employer].  Patient reports no history of malaria or other illnesses contracted during prior travel.     Review of Systems  Constitutional: Positive for fatigue. Negative for chills and fever.  HENT: Positive for dental problem (Reports poor dentition but no current tooth pain or gum pain). Negative for facial swelling, hearing loss, sore throat and trouble swallowing.   Respiratory: Negative for cough.   Cardiovascular: Negative for chest pain, palpitations and leg swelling.  Gastrointestinal: Negative for abdominal pain, blood in stool, constipation and diarrhea.  Genitourinary: Negative for dysuria and frequency.  Musculoskeletal: Positive for arthralgias, back pain, joint swelling, myalgias and neck pain. Negative for gait problem.  Neurological: Negative for dizziness, numbness and headaches.       Objective:   Physical Exam BP 132/89   Pulse 83   Temp 98.4 F (36.9 C) (Oral)   Resp 18   Ht 5\' 8"  (1.727 m)   Wt 183 lb (83 kg)   SpO2 100%   BMI 27.83 kg/m Nurse's notes and vital signs reviewed General-well-nourished, well-developed older male in no acute distress.  Patient is accompanied by his wife  at today's visit. ENT- TMs dull, patient with mild edema of the nasal turbinates, patient with poor dentition and most of his upper teeth are missing and patient with evidence of dental decay but reports no current tooth pain.  Patient with mild posterior pharynx erythema Neck-supple, no lymphadenopathy, no thyromegaly, no carotid bruit Lungs-clear to auscultation bilaterally Cardiovascular-regular rate and rhythm Abdomen-soft, nontender Back-no CVA tenderness, patient with mild  lumbosacral tenderness to palpation Musculoskeletal- patient with decreased peripheral pulse in the right foot, no evidence of cyanosis.  Patient has large palpable nodule versus deformity on the top of the foot at the base of the right great toe/MTP joint.  Patient with right knee medial joint line tenderness and left knee lateral joint line tenderness to palpation.  Patient does not have any reproducible tenderness at either epicondyle of the right elbow but states he has discomfort if he tries to hold objects in this hand and then moves/bends his elbow.  Patient with decreased range of motion at the shoulders bilaterally and discomfort with shoulder range of motion. Extremities-no edema Psychologic-normal mood and judgment       Assessment & Plan:  1. Multiple joint pain Patient given prescription to try ibuprofen 600 mg which he can take every 8 hours as needed for pain but patient should take medication after eating as he is also having issues with acid reflux.  Patient is being placed on omeprazole for acid reflux.  Orders placed for patient to have an x-ray of the right foot and x-rays of both knees.  Patient will follow-up in a few weeks and will likely need referral to orthopedics regarding his multiple joint pain. - omeprazole (PRILOSEC) 20 MG capsule; Take 1 capsule (20 mg total) by mouth 2 (two) times daily. To reduce stomach acid  Dispense: 30 capsule; Refill: 3 - DG Foot Complete Right; Future - DG Knee Complete 4 Views Left; Future - DG Knee Complete 4 Views Right; Future  2. Gastroesophageal reflux disease, esophagitis presence not specified Patient will be placed on omeprazole 20 mg twice daily for reflux.  Patient should avoid spicy/greasy foods as well as avoidance of known trigger foods and avoidance of late night eating. - ibuprofen (ADVIL,MOTRIN) 600 MG tablet; Take 1 tablet (600 mg total) by mouth every 8 (eight) hours as needed. For pain; take after eating  Dispense: 60  tablet; Refill: 0  3. Right foot pain Patient with complaint of pain in the right foot at the MTP joint but patient denies any history of gout.  Patient will be sent for an x-ray of the right foot to see if he may have arthritis in this joint.  4. Urinary frequency Patient with complaint of recent issues with urinary frequency and patient would like to have a PSA.  Patient will have PSA as a screening test for prostate cancer.  BMP to check electrolytes/glucose and hemoglobin A1c to check for diabetes or prediabetes as a possible cause of his urinary frequency.  Patient will also have a UA to look for urinary tract infection - PSA - Basic Metabolic Panel - Hemoglobin A1c - POCT URINALYSIS DIP (CLINITEK)  *Influenza immunization was offered at today's visit but declined by the patient  An After Visit Summary was printed and given to the patient.  Return in about 6 weeks (around 06/03/2018) for Joint pain f/u.

## 2018-04-22 NOTE — Progress Notes (Signed)
Flu shot: no Pain: 9/ 10 right foot bone spir, both knees  From minilitary years, calves and shoulders 5/6 months , AND NECK   Sharp aching, same, movement   Patient stated that his normal is bp is 110's/60's and now it has jumped. He wanted to know why it could have jumped.

## 2018-04-23 ENCOUNTER — Ambulatory Visit (HOSPITAL_COMMUNITY)
Admission: RE | Admit: 2018-04-23 | Discharge: 2018-04-23 | Disposition: A | Discharge status: Home / Self Care | Payer: Self-pay | Source: Ambulatory Visit | Attending: Family Medicine | Admitting: Family Medicine

## 2018-04-23 DIAGNOSIS — M2550 Pain in unspecified joint: Secondary | ICD-10-CM

## 2018-04-23 DIAGNOSIS — M25562 Pain in left knee: Secondary | ICD-10-CM | POA: Insufficient documentation

## 2018-04-23 DIAGNOSIS — M1712 Unilateral primary osteoarthritis, left knee: Secondary | ICD-10-CM | POA: Insufficient documentation

## 2018-04-23 DIAGNOSIS — M25561 Pain in right knee: Secondary | ICD-10-CM | POA: Insufficient documentation

## 2018-04-23 DIAGNOSIS — M19071 Primary osteoarthritis, right ankle and foot: Secondary | ICD-10-CM | POA: Insufficient documentation

## 2018-04-23 DIAGNOSIS — M79671 Pain in right foot: Secondary | ICD-10-CM | POA: Insufficient documentation

## 2018-04-23 LAB — BASIC METABOLIC PANEL WITH GFR
BUN/Creatinine Ratio: 10 (ref 9–20)
BUN: 13 mg/dL (ref 6–24)
CO2: 23 mmol/L (ref 20–29)
Calcium: 9 mg/dL (ref 8.7–10.2)
Chloride: 104 mmol/L (ref 96–106)
Creatinine, Ser: 1.27 mg/dL (ref 0.76–1.27)
GFR calc Af Amer: 72 mL/min/1.73
GFR calc non Af Amer: 62 mL/min/1.73
Glucose: 67 mg/dL (ref 65–99)
Potassium: 4.5 mmol/L (ref 3.5–5.2)
Sodium: 141 mmol/L (ref 134–144)

## 2018-04-23 LAB — HEMOGLOBIN A1C
Est. average glucose Bld gHb Est-mCnc: 103 mg/dL
Hgb A1c MFr Bld: 5.2 % (ref 4.8–5.6)

## 2018-04-23 LAB — PSA: Prostate Specific Ag, Serum: 0.9 ng/mL (ref 0.0–4.0)

## 2018-04-25 ENCOUNTER — Telehealth: Payer: Self-pay | Admitting: *Deleted

## 2018-04-25 DIAGNOSIS — M779 Enthesopathy, unspecified: Secondary | ICD-10-CM

## 2018-04-25 NOTE — Telephone Encounter (Signed)
Medical Assistant left message on patient's home and cell voicemail. Voicemail states to give a call back to [place] with [facility] at [phone]. !!!Patients blood work was normal. Patient has arthritis in the right knee but not the left. Patient has bone spur in the foot and will be referred to podiatry for the pain!!!

## 2018-04-25 NOTE — Telephone Encounter (Signed)
-----   Message from Cain Saupe, MD sent at 04/24/2018 12:01 AM EST ----- Please notify patient that his PSA, urinalysis, hemoglobin A1c and basic metabolic panel were all normal

## 2018-06-04 ENCOUNTER — Ambulatory Visit: Payer: Self-pay | Admitting: Family Medicine

## 2018-06-13 ENCOUNTER — Ambulatory Visit: Payer: Self-pay

## 2018-06-19 ENCOUNTER — Ambulatory Visit: Payer: Self-pay | Attending: Family Medicine | Admitting: Physician Assistant

## 2018-06-19 VITALS — BP 122/80 | HR 80 | Temp 98.6°F | Resp 18 | Ht 68.0 in | Wt 183.0 lb

## 2018-06-19 DIAGNOSIS — J40 Bronchitis, not specified as acute or chronic: Secondary | ICD-10-CM

## 2018-06-19 DIAGNOSIS — Z79899 Other long term (current) drug therapy: Secondary | ICD-10-CM | POA: Insufficient documentation

## 2018-06-19 DIAGNOSIS — Z791 Long term (current) use of non-steroidal anti-inflammatories (NSAID): Secondary | ICD-10-CM | POA: Insufficient documentation

## 2018-06-19 DIAGNOSIS — M19071 Primary osteoarthritis, right ankle and foot: Secondary | ICD-10-CM | POA: Insufficient documentation

## 2018-06-19 DIAGNOSIS — Z7982 Long term (current) use of aspirin: Secondary | ICD-10-CM | POA: Insufficient documentation

## 2018-06-19 DIAGNOSIS — M19079 Primary osteoarthritis, unspecified ankle and foot: Secondary | ICD-10-CM

## 2018-06-19 DIAGNOSIS — Z882 Allergy status to sulfonamides status: Secondary | ICD-10-CM | POA: Insufficient documentation

## 2018-06-19 MED ORDER — BENZONATATE 100 MG PO CAPS: 100.0000 mg | ORAL_CAPSULE | Freq: Two times a day (BID) | ORAL | 0 refills | Status: DC | PRN

## 2018-06-19 MED ORDER — ALBUTEROL SULFATE HFA 108 (90 BASE) MCG/ACT IN AERS: 2.0000 | INHALATION_SPRAY | Freq: Four times a day (QID) | RESPIRATORY_TRACT | 0 refills | Status: DC | PRN

## 2018-06-19 MED ORDER — AZITHROMYCIN 250 MG PO TABS: ORAL_TABLET | ORAL | 0 refills | Status: DC

## 2018-06-19 NOTE — Progress Notes (Signed)
Patient ID: Steven Cobb, male   DOB: 01/26/1961, 58 y.o.   MRN: [id number]   Steven Cobb, is a 58 y.o. male  NWG:[id number]  [id number]  DOB - 01/26/1961  Subjective:  Chief Complaint and HPI: Steven Cobb is a 58 y.o. male here today for f/up on xrays and pain of B knees and R great toe/foot.  Describes pain in great toe as much worse than the knees.  Pain is daily.  X-rays showed severe arthritis.  Knee X-rays showed mild degenerative changes.    Also, 2 week h/o of cough and congestion with yellow-green mucus.  +wheezing(asthma as a child).  No fever/chills  Social:  Married, smoker  ROS:   Constitutional:  No f/c, No night sweats, No unexplained weight loss. EENT:  No vision changes, No blurry vision, No hearing changes. No mouth, throat, or ear problems.  Respiratory: + cough, No SOB, + wheezing Cardiac: No CP, no palpitations GI:  No abd pain, No N/V/D. GU: No Urinary s/sx Musculoskeletal: see above Neuro: No headache, no dizziness, no motor weakness.  Skin: No rash Endocrine:  No polydipsia. No polyuria.  Psych: Denies SI/HI  No problems updated.  ALLERGIES: Allergies  Allergen Reactions  . Sulfa Antibiotics Hives    PAST MEDICAL HISTORY: History reviewed. No pertinent past medical history.  MEDICATIONS AT HOME: Prior to Admission medications   Medication Sig Start Date End Date Taking? Authorizing Provider  aspirin EC 81 MG tablet Take 81 mg by mouth daily.   Yes [provider]  ibuprofen (ADVIL,MOTRIN) 600 MG tablet Take 1 tablet (600 mg total) by mouth every 8 (eight) hours as needed. For pain; take after eating 04/22/18  Yes Fulp, Cammie, MD  omeprazole (PRILOSEC) 20 MG capsule Take 1 capsule (20 mg total) by mouth 2 (two) times daily. To reduce stomach acid 04/22/18  Yes Fulp, Cammie, MD  Prasugrel HCl (EFFIENT PO) Take by mouth.   Yes [provider]  albuterol (PROVENTIL HFA;VENTOLIN HFA) 108 (90 Base) MCG/ACT inhaler Inhale  2 puffs into the lungs every 6 (six) hours as needed for wheezing or shortness of breath. 06/19/18   Anders SimmondsMcClung, Angela M, PA-C  azithromycin (ZITHROMAX) 250 MG tablet Take 2 today then 1 daily 06/19/18   Georgian CoMcClung, Angela M, PA-C  benzonatate (TESSALON) 100 MG capsule Take 1 capsule (100 mg total) by mouth 2 (two) times daily as needed for cough. 06/19/18   Anders SimmondsMcClung, Angela M, PA-C     Objective:  EXAM:   Vitals:   06/19/18 1020  BP: 122/80  Pulse: 80  Resp: 18  Temp: 98.6 F (37 C)  TempSrc: Oral  SpO2: 97%  Weight: 183 lb (83 kg)  Height: 5\' 8"  (1.727 m)    General appearance : A&OX3. NAD. Non-toxic-appearing HEENT: Atraumatic and Normocephalic.  PERRLA. EOM intact.  TM clear B. Mouth-MMM, post pharynx WNL w/o erythema, No PND. Neck: supple, no JVD. No cervical lymphadenopathy. No thyromegaly Chest/Lungs:  Breathing-non-labored, Good air entry bilaterally, breath sounds without rales or rhonchi but there is mild wheezing throughout.   CVS: S1 S2 regular, no murmurs, gallops, rubs  Extremities: Bilateral Lower Ext shows no edema, both legs are warm to touch with = pulse throughout Neurology:  CN II-XII grossly intact, Non focal.   Psych:  TP linear. J/I WNL. Normal speech. Appropriate eye contact and affect.  Skin:  No Rash  Data Review Lab Results  Component Value Date   HGBA1C 5.2 04/22/2018     Assessment &  Plan   1. Bronchitis Will cover for atypicals due to length of illness - azithromycin (ZITHROMAX) 250 MG tablet; Take 2 today then 1 daily  Dispense: 6 tablet; Refill: 0 - benzonatate (TESSALON) 100 MG capsule; Take 1 capsule (100 mg total) by mouth 2 (two) times daily as needed for cough.  Dispense: 20 capsule; Refill: 0 - albuterol (PROVENTIL HFA;VENTOLIN HFA) 108 (90 Base) MCG/ACT inhaler; Inhale 2 puffs into the lungs every 6 (six) hours as needed for wheezing or shortness of breath.  Dispense: 1 Inhaler; Refill: 0 Smoking cessation advised >5 mins counseling.    2.  Arthritis of metatarsophalangeal (MTP) joint of great toe Continue ibuprofen prn - Ambulatory referral to Podiatry   Patient have been counseled extensively about nutrition and exercise  Return in about 2 months (around 08/18/2018) for ? assign PCP-chronic medical issues.  The patient was given clear instructions to go to ER or return to medical center if symptoms don't improve, worsen or new problems develop. The patient verbalized understanding. The patient was told to call to get lab results if they haven't heard anything in the next week.     Georgian Co, PA-C [facility] and [facility] [place], [place] [phone]   06/19/2018, 10:38 AM

## 2018-08-18 ENCOUNTER — Ambulatory Visit: Payer: Self-pay | Admitting: Family Medicine

## 2018-08-20 ENCOUNTER — Telehealth: Payer: Self-pay | Admitting: General Practice

## 2018-08-20 NOTE — Telephone Encounter (Signed)
Called patient to follow up on missed appt 08/18/18. LVM to return call and reschedule.

## 2018-08-25 ENCOUNTER — Other Ambulatory Visit: Payer: Self-pay

## 2018-08-25 ENCOUNTER — Encounter (HOSPITAL_COMMUNITY): Payer: Self-pay | Admitting: *Deleted

## 2018-08-25 ENCOUNTER — Emergency Department (HOSPITAL_COMMUNITY)
Admission: EM | Admit: 2018-08-25 | Discharge: 2018-08-25 | Disposition: A | Discharge status: Home / Self Care | Payer: Self-pay | Attending: Emergency Medicine | Admitting: Emergency Medicine

## 2018-08-25 ENCOUNTER — Emergency Department (HOSPITAL_COMMUNITY): Payer: Self-pay

## 2018-08-25 DIAGNOSIS — Z79899 Other long term (current) drug therapy: Secondary | ICD-10-CM | POA: Insufficient documentation

## 2018-08-25 DIAGNOSIS — R05 Cough: Secondary | ICD-10-CM | POA: Insufficient documentation

## 2018-08-25 DIAGNOSIS — Z7982 Long term (current) use of aspirin: Secondary | ICD-10-CM | POA: Insufficient documentation

## 2018-08-25 DIAGNOSIS — R51 Headache: Secondary | ICD-10-CM | POA: Insufficient documentation

## 2018-08-25 DIAGNOSIS — R519 Headache, unspecified: Secondary | ICD-10-CM

## 2018-08-25 DIAGNOSIS — F17210 Nicotine dependence, cigarettes, uncomplicated: Secondary | ICD-10-CM | POA: Insufficient documentation

## 2018-08-25 DIAGNOSIS — R059 Cough, unspecified: Secondary | ICD-10-CM

## 2018-08-25 MED ORDER — AZITHROMYCIN 250 MG PO TABS: 250.0000 mg | ORAL_TABLET | Freq: Every day | ORAL | 0 refills | Status: DC

## 2018-08-25 MED ORDER — FLUTICASONE PROPIONATE 50 MCG/ACT NA SUSP: 2.0000 | Freq: Every day | NASAL | 0 refills | Status: DC

## 2018-08-25 MED ORDER — ALBUTEROL SULFATE HFA 108 (90 BASE) MCG/ACT IN AERS: 2.0000 | INHALATION_SPRAY | RESPIRATORY_TRACT | 0 refills | Status: DC | PRN

## 2018-08-25 MED ORDER — KETOROLAC TROMETHAMINE 60 MG/2ML IM SOLN
30.0000 mg | Freq: Once | INTRAMUSCULAR | Status: AC
  Administered 2018-08-25: 30 mg via INTRAMUSCULAR
  Filled 2018-08-25: qty 2

## 2018-08-25 MED ORDER — PREDNISONE 10 MG (21) PO TBPK: ORAL_TABLET | ORAL | 0 refills | Status: DC

## 2018-08-25 MED ORDER — PREDNISONE 20 MG PO TABS
60.0000 mg | ORAL_TABLET | Freq: Once | ORAL | Status: AC
  Administered 2018-08-25: 60 mg via ORAL
  Filled 2018-08-25: qty 3

## 2018-08-25 MED ORDER — DOXYCYCLINE HYCLATE 100 MG PO CAPS: 100.0000 mg | ORAL_CAPSULE | Freq: Two times a day (BID) | ORAL | 0 refills | Status: AC

## 2018-08-25 NOTE — ED Provider Notes (Signed)
[facility]-EMERGENCY DEPT Provider Note   CSN: [id number] Arrival date & time: 08/25/18  3299    History   Chief Complaint Chief Complaint  Patient presents with  . flu-like symptoms  . Cough    HPI Steven Cobb is a 58 y.o. male.     HPI   Steven Cobb is a 58 y.o. male, patient with no pertinent past medical history, presenting to the ED with intermittently productive cough for last 3 days.  Intermittent shortness of breath, especially with coughing.  Accompanied by sinus congestion and pressure in the maxillary and frontal sinuses, causing what he refers to as a sinus headache.  Also notes chills and body aches.  He has taken single doses of aspirin, ibuprofen, and Tylenol. He admits to poor fluid intake at baseline.  States his wife had similar symptoms last week that have now resolved. Denies known fever, N/V/D, chest pain, abdominal pain, syncope, neuro deficits, neck pain/stiffness, or any other complaints.     History reviewed. No pertinent past medical history.  There are no active problems to display for this patient.   Past Surgical History:  Procedure Laterality Date  . SHOULDER SURGERY          Home Medications    Prior to Admission medications   Medication Sig Start Date End Date Taking? Authorizing Provider  albuterol (PROVENTIL HFA;VENTOLIN HFA) 108 (90 Base) MCG/ACT inhaler Inhale 2 puffs into the lungs every 4 (four) hours as needed for wheezing or shortness of breath. 08/25/18   Joy, Hillard Danker, PA-C  aspirin EC 81 MG tablet Take 81 mg by mouth daily.    [provider]  benzonatate (TESSALON) 100 MG capsule Take 1 capsule (100 mg total) by mouth 2 (two) times daily as needed for cough. 06/19/18   Anders Simmonds, PA-C  doxycycline (VIBRAMYCIN) 100 MG capsule Take 1 capsule (100 mg total) by mouth 2 (two) times daily for 7 days. 08/25/18 09/01/18  Joy, Shawn C, PA-C  fluticasone (FLONASE) 50 MCG/ACT nasal spray Place  2 sprays into both nostrils daily. 08/25/18   Joy, Shawn C, PA-C  ibuprofen (ADVIL,MOTRIN) 600 MG tablet Take 1 tablet (600 mg total) by mouth every 8 (eight) hours as needed. For pain; take after eating 04/22/18   Fulp, Cammie, MD  omeprazole (PRILOSEC) 20 MG capsule Take 1 capsule (20 mg total) by mouth 2 (two) times daily. To reduce stomach acid 04/22/18   Fulp, Cammie, MD  Prasugrel HCl (EFFIENT PO) Take by mouth.    [provider]  predniSONE (STERAPRED UNI-PAK 21 TAB) 10 MG (21) TBPK tablet Take 6 tabs (60mg ) day 1, 5 tabs (50mg ) day 2, 4 tabs (40mg ) day 3, 3 tabs (30mg ) day 4, 2 tabs (20mg ) day 5, and 1 tab (10mg ) day 6. 08/25/18   Joy, Hillard Danker, PA-C    Family History Family History  Problem Relation Age of Onset  . Coronary artery disease Mother   . Coronary artery disease Father     Social History Social History   Tobacco Use  . Smoking status: Current Every Day Smoker    Packs/day: 0.45    Types: Cigarettes  . Smokeless tobacco: Never Used  Substance Use Topics  . Alcohol use: No    Comment: occassionally   . Drug use: Yes    Types: Marijuana     Allergies   Sulfa antibiotics   Review of Systems Review of Systems  Constitutional: Positive for chills. Negative  for fever.  HENT: Positive for congestion and sinus pressure. Negative for ear pain, sore throat and trouble swallowing.   Respiratory: Positive for cough and shortness of breath (occasional).   Cardiovascular: Negative for chest pain.  Gastrointestinal: Negative for abdominal pain, diarrhea, nausea and vomiting.  Musculoskeletal: Positive for myalgias. Negative for neck pain and neck stiffness.  Neurological: Positive for headaches. Negative for dizziness, syncope, weakness, light-headedness and numbness.  All other systems reviewed and are negative.    Physical Exam Updated Vital Signs BP 110/86 (BP Location: Left Arm)   Pulse (!) 103   Temp 99.5 F (37.5 C) (Oral) Comment: Pt took 3 x 250 mg  ibuprofen around 00:00  Resp 18   Ht 5\' 8"  (1.727 m)   Wt 83 kg   SpO2 97%   BMI 27.83 kg/m   Physical Exam Vitals signs and nursing note reviewed.  Constitutional:      General: He is not in acute distress.    Appearance: He is well-developed. He is not diaphoretic.  HENT:     Head: Normocephalic and atraumatic.     Nose: Mucosal edema present.     Right Sinus: Maxillary sinus tenderness present.     Left Sinus: Maxillary sinus tenderness present.     Mouth/Throat:     Mouth: Mucous membranes are moist.     Pharynx: Oropharynx is clear.  Eyes:     Conjunctiva/sclera: Conjunctivae normal.  Neck:     Musculoskeletal: Neck supple.  Cardiovascular:     Rate and Rhythm: Normal rate and regular rhythm.     Pulses: Normal pulses.     Heart sounds: Normal heart sounds.     Comments: Tactile temperature in the extremities appropriate and equal bilaterally. Pulmonary:     Effort: Pulmonary effort is normal. No respiratory distress.     Breath sounds: Normal breath sounds.  Abdominal:     Palpations: Abdomen is soft.     Tenderness: There is no abdominal tenderness. There is no guarding.  Musculoskeletal:     Right lower leg: No edema.     Left lower leg: No edema.  Lymphadenopathy:     Cervical: No cervical adenopathy.  Skin:    General: Skin is warm and dry.  Neurological:     Mental Status: He is alert.     Comments: Sensation grossly intact to light touch in the extremities.  Grip strengths equal bilaterally.  Strength 5/5 in all extremities. No gait disturbance. Coordination intact. Cranial nerves III-XII grossly intact. No facial droop.   Psychiatric:        Mood and Affect: Mood and affect normal.        Speech: Speech normal.        Behavior: Behavior normal.      ED Treatments / Results  Labs (all labs ordered are listed, but only abnormal results are displayed) Labs Reviewed - No data to display  EKG None  Radiology Dg Chest 2 View  Result Date:  08/25/2018 CLINICAL DATA:  Generalized body aches. Productive cough. EXAM: CHEST - 2 VIEW COMPARISON:  09/08/2014 FINDINGS: Cardiomediastinal silhouette is normal. Mediastinal contours appear intact. There is no evidence of pleural effusion or pneumothorax. Mild peribronchial airspace consolidation with central predominance. Osseous structures are without acute abnormality. Soft tissues are grossly normal. IMPRESSION: Mild peribronchial airspace consolidation with central predominance which may be seen with acute bronchitis or atypical pneumonia. Electronically Signed   By: Ted Mcalpine M.D.   On: 08/25/2018 09:04  Procedures Procedures (including critical care time)  Medications Ordered in ED Medications  predniSONE (DELTASONE) tablet 60 mg (60 mg Oral Given 08/25/18 0942)  ketorolac (TORADOL) injection 30 mg (30 mg Intramuscular Given 08/25/18 0944)     Initial Impression / Assessment and Plan / ED Course  I have reviewed the triage vital signs and the nursing notes.  Pertinent labs & imaging results that were available during my care of the patient were reviewed by me and considered in my medical decision making (see chart for details).  Clinical Course as of Aug 24 1098  Mon Aug 25, 2018  4098 Patient's pulse is manually noted to be 78.  Pulse Rate(!): 103 [SJ]    Clinical Course User Index [SJ] Joy, Shawn C, PA-C       Patient presents with cough, sinus pressure, and occasional shortness of breath. Patient is nontoxic appearing, afebrile, not tachycardic on my exam, not tachypneic, not hypotensive, excellent SPO2 on room air, and is in no apparent distress.  Chest x-ray with pneumonia versus bronchitis. The patient was given instructions for home care as well as return precautions. Patient voices understanding of these instructions, accepts the plan, and is comfortable with discharge.  Vitals:   08/25/18 0652 08/25/18 0941  BP: 110/86 106/77  Pulse: (!) 103 83  Resp: 18    Temp: 99.5 F (37.5 C)   TempSrc: Oral   SpO2: 97% 99%  Weight: 83 kg   Height:  (1.727 m)       Final Clinical Impressions(s) / ED Diagnoses   Final diagnoses:  Cough  Sinus headache    ED Discharge Orders         Ordered    albuterol (PROVENTIL HFA;VENTOLIN HFA) 108 (90 Base) MCG/ACT inhaler  Every 4 hours PRN     08/25/18 0910    predniSONE (STERAPRED UNI-PAK 21 TAB) 10 MG (21) TBPK tablet     08/25/18 0910    azithromycin (ZITHROMAX) 250 MG tablet  Daily,   Status:  Discontinued     08/25/18 0910    fluticasone (FLONASE) 50 MCG/ACT nasal spray  Daily     08/25/18 0910    doxycycline (VIBRAMYCIN) 100 MG capsule  2 times daily     08/25/18 0912           Anselm Pancoast, PA-C 08/25/18 1100    Geoffery Lyons, MD 08/25/18 1436

## 2018-08-25 NOTE — ED Notes (Signed)
ED PA at bedside

## 2018-08-25 NOTE — ED Triage Notes (Signed)
Pt c/o generalized body aches, chills and productive cough since Friday.

## 2018-08-25 NOTE — Discharge Instructions (Addendum)
There is a question of pneumonia (which may be bacterial) versus bronchitis (which is typically viral) on the chest x-ray.  We will try a course of antibiotics, but otherwise treatment is symptomatic.  Please take all of your antibiotics until finished!   You may develop abdominal discomfort or diarrhea from the antibiotic.  You may help offset this with probiotics which you can buy or get in yogurt. Do not eat or take the probiotics until 2 hours after your antibiotic.   Hand washing: Wash your hands throughout the day, but especially before and after touching the face, using the restroom, sneezing, coughing, or touching surfaces that have been coughed or sneezed upon. Hydration: Symptoms of most illnesses will be intensified and complicated by dehydration. Dehydration can also extend the duration of symptoms. Drink plenty of fluids and get plenty of rest. You should be drinking at least half a liter of water an hour to stay hydrated. Electrolyte drinks (ex. Gatorade, Powerade, Pedialyte) are also encouraged. You should be drinking enough fluids to make your urine light yellow, almost clear. If this is not the case, you are not drinking enough water. Please note that some of the treatments indicated below will not be effective if you are not adequately hydrated. Diet: Please concentrate on hydration, however, you may introduce food slowly.  Start with a clear liquid diet, progressed to a full liquid diet, and then bland solids as you are able. Pain or fever: Ibuprofen, Naproxen, or acetaminophen (generic for Tylenol) for pain or fever.  Antiinflammatory medications: Take 600 mg of ibuprofen every 6 hours or 440 mg (over the counter dose) to 500 mg (prescription dose) of naproxen every 12 hours for the next 3 days. After this time, these medications may be used as needed for pain. Take these medications with food to avoid upset stomach. Choose only one of these medications, do not take them  together. Acetaminophen (generic for Tylenol): Should you continue to have additional pain while taking the ibuprofen or naproxen, you may add in acetaminophen as needed. Your daily total maximum amount of acetaminophen from all sources should be limited to 4000mg /day for persons without liver problems, or 2000mg /day for those with liver problems. Cough: Teas, warm liquids, broths, and honey can help with cough. Albuterol: May use the albuterol as needed for instances of shortness of breath. Prednisone: Take the prednisone, as directed, in its entirety. Zyrtec or Claritin: May add these medication daily to control underlying symptoms of congestion, sneezing, and other signs of allergies.  These medications are available over-the-counter. Generics: Cetirizine (generic for Zyrtec) and loratadine (generic for Claritin). Fluticasone: Use fluticasone (generic for Flonase), as directed, for nasal and sinus congestion.  This medication is available over-the-counter. Congestion: Plain guaifenesin (generic for plain Mucinex) may help relieve congestion. Saline sinus rinses and saline nasal sprays may also help relieve congestion.  Sore throat: Warm liquids or Chloraseptic spray may help soothe a sore throat. Gargle twice a day with a salt water solution made from a half teaspoon of salt in a cup of warm water.  Follow up: Follow up with a primary care provider within the next two weeks should symptoms fail to resolve. Return: Return to the ED for significantly worsening symptoms, shortness of breath, persistent vomiting, large amounts of blood in stool, or any other major concerns.  For prescription assistance, may try using prescription discount sites or apps, such as goodrx.com

## 2018-10-20 ENCOUNTER — Emergency Department (HOSPITAL_COMMUNITY)
Admission: EM | Admit: 2018-10-20 | Discharge: 2018-10-20 | Disposition: A | Discharge status: Home / Self Care | Payer: Self-pay | Attending: Emergency Medicine | Admitting: Emergency Medicine

## 2018-10-20 ENCOUNTER — Emergency Department (HOSPITAL_BASED_OUTPATIENT_CLINIC_OR_DEPARTMENT_OTHER): Payer: Self-pay

## 2018-10-20 ENCOUNTER — Other Ambulatory Visit: Payer: Self-pay

## 2018-10-20 ENCOUNTER — Encounter (HOSPITAL_COMMUNITY): Payer: Self-pay | Admitting: Emergency Medicine

## 2018-10-20 DIAGNOSIS — A084 Viral intestinal infection, unspecified: Secondary | ICD-10-CM | POA: Insufficient documentation

## 2018-10-20 DIAGNOSIS — R600 Localized edema: Secondary | ICD-10-CM | POA: Insufficient documentation

## 2018-10-20 DIAGNOSIS — Z7982 Long term (current) use of aspirin: Secondary | ICD-10-CM | POA: Insufficient documentation

## 2018-10-20 DIAGNOSIS — F17210 Nicotine dependence, cigarettes, uncomplicated: Secondary | ICD-10-CM | POA: Insufficient documentation

## 2018-10-20 DIAGNOSIS — M7989 Other specified soft tissue disorders: Secondary | ICD-10-CM

## 2018-10-20 DIAGNOSIS — K029 Dental caries, unspecified: Secondary | ICD-10-CM | POA: Insufficient documentation

## 2018-10-20 DIAGNOSIS — R609 Edema, unspecified: Secondary | ICD-10-CM

## 2018-10-20 DIAGNOSIS — Z79899 Other long term (current) drug therapy: Secondary | ICD-10-CM | POA: Insufficient documentation

## 2018-10-20 MED ORDER — RANITIDINE HCL 150 MG PO CAPS: 150.0000 mg | ORAL_CAPSULE | Freq: Every day | ORAL | 0 refills | Status: DC

## 2018-10-20 MED ORDER — ONDANSETRON 4 MG PO TBDP
4.0000 mg | ORAL_TABLET | Freq: Once | ORAL | Status: AC
  Administered 2018-10-20: 4 mg via ORAL
  Filled 2018-10-20: qty 1

## 2018-10-20 MED ORDER — LOPERAMIDE HCL 2 MG PO CAPS: 2.0000 mg | ORAL_CAPSULE | Freq: Four times a day (QID) | ORAL | 0 refills | Status: DC | PRN

## 2018-10-20 MED ORDER — ONDANSETRON HCL 4 MG PO TABS: 4.0000 mg | ORAL_TABLET | Freq: Four times a day (QID) | ORAL | 0 refills | Status: DC

## 2018-10-20 NOTE — ED Provider Notes (Signed)
[facility] EMERGENCY DEPARTMENT Provider Note   CSN: [id number] Arrival date & time: 10/20/18  4098    History   Chief Complaint Chief Complaint  Patient presents with  . Dental Pain  . Foot Pain    HPI Steven Cobb is a 58 y.o. male.     HPI   58 year old male presents today with several complaints.  Patient notes that over the last 2 days he has had swelling in his right foot and ankle.  He denies any trauma, denies any pain, denies any redness warmth or inability to ambulate.  No history of the same.  He reports he did not even notice it that his significant other noticed this.  He denies any history DVT or PE or any significant risk factors.  No chest pain or shortness of breath.  Patient also notes he has had some nausea vomiting and diarrhea over the last several days.  He denies any associated abdominal pain or fever.  No abnormal food or drink.  No close sick contacts.  No medications prior to arrival.  Patient notes that his acid reflux has been acting up with more belching and indigestion.  Patient also reports that he needs 1 of his teeth pulled and will have this pulled in 3 days.  He denies any pain, swelling, redness or discharge..  Nursing note reports ankle and foot pain along with dental pain, patient is adamant he is not having pain in any of these locations.    History reviewed. No pertinent past medical history.  There are no active problems to display for this patient.   Past Surgical History:  Procedure Laterality Date  . SHOULDER SURGERY          Home Medications    Prior to Admission medications   Medication Sig Start Date End Date Taking? Authorizing Provider  albuterol (PROVENTIL HFA;VENTOLIN HFA) 108 (90 Base) MCG/ACT inhaler Inhale 2 puffs into the lungs every 4 (four) hours as needed for wheezing or shortness of breath. 08/25/18   Joy, Hillard Danker, PA-C  aspirin EC 81 MG tablet Take 81 mg by mouth daily.    [provider]  benzonatate (TESSALON) 100 MG capsule Take 1 capsule (100 mg total) by mouth 2 (two) times daily as needed for cough. 06/19/18   Anders Simmonds, PA-C  fluticasone (FLONASE) 50 MCG/ACT nasal spray Place 2 sprays into both nostrils daily. 08/25/18   Joy, Shawn C, PA-C  ibuprofen (ADVIL,MOTRIN) 600 MG tablet Take 1 tablet (600 mg total) by mouth every 8 (eight) hours as needed. For pain; take after eating 04/22/18   Fulp, Cammie, MD  loperamide (IMODIUM) 2 MG capsule Take 1 capsule (2 mg total) by mouth 4 (four) times daily as needed for diarrhea or loose stools. 10/20/18   Hedges, Tinnie Gens, PA-C  omeprazole (PRILOSEC) 20 MG capsule Take 1 capsule (20 mg total) by mouth 2 (two) times daily. To reduce stomach acid 04/22/18   Fulp, Cammie, MD  ondansetron (ZOFRAN) 4 MG tablet Take 1 tablet (4 mg total) by mouth every 6 (six) hours. 10/20/18   Hedges, Tinnie Gens, PA-C  Prasugrel HCl (EFFIENT PO) Take by mouth.    [provider]  predniSONE (STERAPRED UNI-PAK 21 TAB) 10 MG (21) TBPK tablet Take 6 tabs ( ) day 1, 5 tabs ( ) day 2, 4 tabs ( ) day 3, 3 tabs ( ) day 4, 2 tabs ( ) day 5, and 1 tab ( ) day 6. 08/25/18   Joy, Ines Bloomer  C, PA-C  ranitidine (ZANTAC) 150 MG capsule Take 1 capsule (150 mg total) by mouth daily. 10/20/18   Eyvonne MechanicHedges, Jeffrey, PA-C    Family History Family History  Problem Relation Age of Onset  . Coronary artery disease Mother   . Coronary artery disease Father     Social History Social History   Tobacco Use  . Smoking status: Current Every Day Smoker    Packs/day: 0.45    Types: Cigarettes  . Smokeless tobacco: Never Used  Substance Use Topics  . Alcohol use: No    Comment: occassionally   . Drug use: Yes    Types: Marijuana     Allergies   Sulfa antibiotics   Review of Systems Review of Systems  All other systems reviewed and are negative.    Physical Exam Updated Vital Signs BP (!) 133/93 (BP Location: Right Arm)   Pulse 83    Temp 98.1 F (36.7 C) (Oral)   Resp 16   Ht 5\' 8"  (1.727 m)   Wt 79.8 kg   SpO2 98%   BMI 26.76 kg/m   Physical Exam Vitals signs and nursing note reviewed.  Constitutional:      Appearance: He is well-developed.  HENT:     Head: Normocephalic and atraumatic.     Comments: Dental decay noted throughout the mouth numerous missing teeth, gumline without swelling or tenderness Eyes:     General: No scleral icterus.       Right eye: No discharge.        Left eye: No discharge.     Conjunctiva/sclera: Conjunctivae normal.     Pupils: Pupils are equal, round, and reactive to light.  Neck:     Musculoskeletal: Normal range of motion.     Vascular: No JVD.     Trachea: No tracheal deviation.  Pulmonary:     Effort: Pulmonary effort is normal.     Breath sounds: No stridor.  Abdominal:     Comments: Soft nontender  Musculoskeletal:     Comments: Minor edema to the ankle and dorsal foot, no redness warmth to touch tenderness palpation pedal pulse 2+ cap refill less than 3 seconds-ankle full active range of motion calf nontender  Neurological:     Mental Status: He is alert and oriented to person, place, and time.     Coordination: Coordination normal.  Psychiatric:        Behavior: Behavior normal.        Thought Content: Thought content normal.        Judgment: Judgment normal.      ED Treatments / Results  Labs (all labs ordered are listed, but only abnormal results are displayed) Labs Reviewed - No data to display  EKG None  Radiology Vas [place] Lower Extremity Venous (dvt) (only [facility])  Result Date: 10/20/2018  Lower Venous Study Indications: Swelling.  Performing Technologist: Jeb LeveringJill Parker RDMS, RVT  Examination Guidelines: A complete evaluation includes B-mode imaging, spectral Doppler, color Doppler, and power Doppler as needed of all accessible portions of each vessel. Bilateral testing is considered an integral part of a complete examination. Limited examinations for  reoccurring indications may be performed as noted.  +---------+---------------+---------+-----------+----------+-------+ RIGHT    CompressibilityPhasicitySpontaneityPropertiesSummary +---------+---------------+---------+-----------+----------+-------+ CFV      Full           Yes      Yes                          +---------+---------------+---------+-----------+----------+-------+  SFJ      Full                                                 +---------+---------------+---------+-----------+----------+-------+ FV Prox  Full                                                 +---------+---------------+---------+-----------+----------+-------+ FV Mid   Full                                                 +---------+---------------+---------+-----------+----------+-------+ FV DistalFull                                                 +---------+---------------+---------+-----------+----------+-------+ PFV      Full                                                 +---------+---------------+---------+-----------+----------+-------+ POP      Full           Yes      Yes                          +---------+---------------+---------+-----------+----------+-------+ PTV      Full                                                 +---------+---------------+---------+-----------+----------+-------+ PERO     Full                                                 +---------+---------------+---------+-----------+----------+-------+   +----+---------------+---------+-----------+----------+--------------+ LEFTCompressibilityPhasicitySpontaneityPropertiesSummary        +----+---------------+---------+-----------+----------+--------------+ CFV                                              Not visualized +----+---------------+---------+-----------+----------+--------------+     Summary: Right: There is no evidence of deep vein thrombosis in the lower extremity. No  cystic structure found in the popliteal fossa.  *See table(s) above for measurements and observations.    Preliminary     Procedures Procedures (including critical care time)  Medications Ordered in ED Medications  ondansetron (ZOFRAN-ODT) disintegrating tablet 4 mg (4 mg Oral Given 10/20/18 0907)     Initial Impression / Assessment and Plan / ED Course  I have reviewed the triage vital signs and the nursing notes.  Pertinent labs & imaging results that were available during my care of the patient  were reviewed by me and considered in my medical decision making (see chart for details).        Labs:   Imaging:  Consults:  Therapeutics: Zofran  Discharge Meds: Zofran, ranitidine, Imodium  Assessment/Plan: 58 year old male presents today with several complaints.  Patient reports a bad tooth that needs to be removed.  He has no associated pain or signs of infection with this.  He has outpatient follow-up already arranged.  Patient with nausea vomiting diarrhea, likely viral gastroenteritis no abdominal pain tolerating p.o.. Patient having edema in his right foot and ankle.  No trauma, no signs of infection presently low suspicion for gout, cellulitis, no DVT noted on ultrasound.  Patient does note this comes and goes.  Patient will have outpatient follow-up with his primary care for ongoing evaluation management.  No signs of acute vascular etiology.  Patient is given strict return precautions, he verbalized understanding and agreement to today's plan had no further questions or concerns at time of discharge.   Final Clinical Impressions(s) / ED Diagnoses   Final diagnoses:  Edema, unspecified type  Dental caries  Viral gastroenteritis    ED Discharge Orders         Ordered    ondansetron (ZOFRAN) 4 MG tablet  Every 6 hours     10/20/18 1106    loperamide (IMODIUM) 2 MG capsule  4 times daily PRN     10/20/18 1106    ranitidine (ZANTAC) 150 MG capsule  Daily     10/20/18  1106           HedgesTinnie Gens, PA-C 10/20/18 1107    Melene Plan, DO 10/20/18 1440

## 2018-10-20 NOTE — Progress Notes (Signed)
RLE venous duplex       has been completed. Preliminary results can be found under CV proc through chart review. Jill Parker, BS, RDMS, RVT   

## 2018-10-20 NOTE — Discharge Instructions (Signed)
Please read attached information. If you experience any new or worsening signs or symptoms please return to the emergency room for evaluation. Please follow-up with your primary care provider or specialist as discussed. Please use medication prescribed only as directed and discontinue taking if you have any concerning signs or symptoms.   °

## 2018-10-20 NOTE — ED Triage Notes (Signed)
Pt in with c/o R foot ankle pain and swelling x few days. Able to ambulate. Also having L side dental pain.

## 2019-08-27 ENCOUNTER — Encounter (HOSPITAL_COMMUNITY): Payer: Self-pay | Admitting: Emergency Medicine

## 2019-08-27 ENCOUNTER — Other Ambulatory Visit: Payer: Self-pay

## 2019-08-27 ENCOUNTER — Emergency Department (HOSPITAL_COMMUNITY)
Admission: EM | Admit: 2019-08-27 | Discharge: 2019-08-28 | Disposition: A | Discharge status: Home / Self Care | Payer: Self-pay | Attending: Emergency Medicine | Admitting: Emergency Medicine

## 2019-08-27 ENCOUNTER — Emergency Department (HOSPITAL_COMMUNITY): Payer: Self-pay

## 2019-08-27 DIAGNOSIS — R109 Unspecified abdominal pain: Secondary | ICD-10-CM

## 2019-08-27 DIAGNOSIS — Y998 Other external cause status: Secondary | ICD-10-CM | POA: Insufficient documentation

## 2019-08-27 DIAGNOSIS — Y9389 Activity, other specified: Secondary | ICD-10-CM | POA: Insufficient documentation

## 2019-08-27 DIAGNOSIS — F1210 Cannabis abuse, uncomplicated: Secondary | ICD-10-CM | POA: Insufficient documentation

## 2019-08-27 DIAGNOSIS — W1789XA Other fall from one level to another, initial encounter: Secondary | ICD-10-CM | POA: Insufficient documentation

## 2019-08-27 DIAGNOSIS — F17210 Nicotine dependence, cigarettes, uncomplicated: Secondary | ICD-10-CM | POA: Insufficient documentation

## 2019-08-27 DIAGNOSIS — W19XXXA Unspecified fall, initial encounter: Secondary | ICD-10-CM

## 2019-08-27 DIAGNOSIS — Y92812 Truck as the place of occurrence of the external cause: Secondary | ICD-10-CM | POA: Insufficient documentation

## 2019-08-27 DIAGNOSIS — H1131 Conjunctival hemorrhage, right eye: Secondary | ICD-10-CM

## 2019-08-27 DIAGNOSIS — S61411A Laceration without foreign body of right hand, initial encounter: Secondary | ICD-10-CM

## 2019-08-27 LAB — COMPREHENSIVE METABOLIC PANEL
ALT: 21 U/L (ref 0–44)
AST: 30 U/L (ref 15–41)
Albumin: 3.8 g/dL (ref 3.5–5.0)
Alkaline Phosphatase: 62 U/L (ref 38–126)
Anion gap: 9 (ref 5–15)
BUN: 10 mg/dL (ref 6–20)
CO2: 22 mmol/L (ref 22–32)
Calcium: 8.7 mg/dL — ABNORMAL LOW (ref 8.9–10.3)
Chloride: 109 mmol/L (ref 98–111)
Creatinine, Ser: 1.06 mg/dL (ref 0.61–1.24)
GFR calc Af Amer: >60 mL/min (ref >60)
GFR calc non Af Amer: >60 mL/min (ref >60)
Glucose, Bld: 98 mg/dL (ref 70–99)
Potassium: 3.2 mmol/L — ABNORMAL LOW (ref 3.5–5.1)
Sodium: 140 mmol/L (ref 135–145)
Total Bilirubin: 0.6 mg/dL (ref 0.3–1.2)
Total Protein: 6.7 g/dL (ref 6.5–8.1)

## 2019-08-27 LAB — CBC
HCT: 42.4 % (ref 39.0–52.0)
Hemoglobin: 14.4 g/dL (ref 13.0–17.0)
MCH: 32.6 pg (ref 26.0–34.0)
MCHC: 34 g/dL (ref 30.0–36.0)
MCV: 95.9 fL (ref 80.0–100.0)
Platelets: 205 10*3/uL (ref 150–400)
RBC: 4.42 MIL/uL (ref 4.22–5.81)
RDW: 14.2 % (ref 11.5–15.5)
WBC: 10.6 10*3/uL — ABNORMAL HIGH (ref 4.0–10.5)
nRBC: 0 % (ref 0.0–0.2)

## 2019-08-27 MED ORDER — OXYCODONE HCL 5 MG PO TABS
5.0000 mg | ORAL_TABLET | Freq: Once | ORAL | Status: AC
  Administered 2019-08-27: 5 mg via ORAL
  Filled 2019-08-27: qty 1

## 2019-08-27 MED ORDER — SODIUM CHLORIDE (PF) 0.9 % IJ SOLN
INTRAMUSCULAR | Status: AC
  Filled 2019-08-27: qty 50

## 2019-08-27 MED ORDER — IOHEXOL 300 MG/ML  SOLN
100.0000 mL | Freq: Once | INTRAMUSCULAR | Status: AC | PRN
  Administered 2019-08-27: 100 mL via INTRAVENOUS

## 2019-08-27 NOTE — ED Notes (Signed)
Pt lying in bed. Warm blanket given. NAD noted. Will continue to monitor.

## 2019-08-27 NOTE — ED Provider Notes (Signed)
[facility] Emergency Department Provider Note MRN:  [id number]  Arrival date & time: 08/27/19     Chief Complaint   Fall   History of Present Illness   Steven Cobb is a 59 y.o. year-old male with no pertinent past medical history presenting to the ED with chief complaint of fall.  Patient was loading a truck, standing on the long bed, took a step backwards and fell off of the long bed.  Struck right flank against a metal railing.  This fall occurred yesterday morning.  Laceration to his right hand as well.  Denies head trauma, no loss of consciousness, no neck pain.  No anterior abdominal pain, no hematuria, no other injuries to the arms or legs, no numbness or weakness.  Pain is moderate to severe, worse with motion.  Review of Systems  A complete 10 system review of systems was obtained and all systems are negative except as noted in the HPI and PMH.   Patient's Health History   History reviewed. No pertinent past medical history.  Past Surgical History:  Procedure Laterality Date  . SHOULDER SURGERY      Family History  Problem Relation Age of Onset  . Coronary artery disease Mother   . Coronary artery disease Father     Social History   Socioeconomic History  . Marital status: Married    Spouse name: Not on file  . Number of children: Not on file  . Years of education: Not on file  . Highest education level: Not on file  Occupational History  . Not on file  Tobacco Use  . Smoking status: Current Every Day Smoker    Packs/day: 0.45    Types: Cigarettes  . Smokeless tobacco: Never Used  Substance and Sexual Activity  . Alcohol use: No    Comment: occassionally   . Drug use: Yes    Types: Marijuana  . Sexual activity: Not on file  Other Topics Concern  . Not on file  Social History Narrative  . Not on file   Social Determinants of Health   Financial Resource Strain:   . Difficulty of Paying Living Expenses:   Food Insecurity:    . Worried About Charity fundraiser in the Last Year:   . Arboriculturist in the Last Year:   Transportation Needs:   . Film/video editor (Medical):   Marland Kitchen Lack of Transportation (Non-Medical):   Physical Activity:   . Days of Exercise per Week:   . Minutes of Exercise per Session:   Stress:   . Feeling of Stress :   Social Connections:   . Frequency of Communication with Friends and Family:   . Frequency of Social Gatherings with Friends and Family:   . Attends Religious Services:   . Active Member of Clubs or Organizations:   . Attends Archivist Meetings:   Marland Kitchen Marital Status:   Intimate Partner Violence:   . Fear of Current or Ex-Partner:   . Emotionally Abused:   Marland Kitchen Physically Abused:   . Sexually Abused:      Physical Exam   Vitals:   08/27/19 2131  BP: (!) 145/103  Pulse: 94  Resp: 16  Temp: 98 F (36.7 C)  SpO2: 99%    CONSTITUTIONAL: Well-appearing, NAD NEURO:  Alert and oriented x 3, no focal deficits EYES:  eyes equal and reactive ENT/NECK:  no LAD, no JVD CARDIO: Regular rate, well-perfused, normal S1 and S2 PULM:  CTAB no wheezing or rhonchi GI/GU:  normal bowel sounds, non-distended, tenderness to palpation of the right flank MSK/SPINE:  No gross deformities, no edema SKIN:  no rash, atraumatic PSYCH:  Appropriate speech and behavior  *Additional and/or pertinent findings included in MDM below  Diagnostic and Interventional Summary    EKG Interpretation  Date/Time:    Ventricular Rate:    PR Interval:    QRS Duration:   QT Interval:    QTC Calculation:   R Axis:     Text Interpretation:        Labs Reviewed  CBC  COMPREHENSIVE METABOLIC PANEL    CT ABDOMEN PELVIS W CONTRAST    (Results Pending)  DG Chest Port 1 View    (Results Pending)    Medications  oxyCODONE (Oxy IR/ROXICODONE) immediate release tablet 5 mg (5 mg Oral Given 08/27/19 2237)     Procedures  /  Critical Care Procedures  ED Course and Medical  Decision Making  I have reviewed the triage vital signs, the nursing notes, and pertinent available records from the EMR.  Pertinent labs & imaging results that were available during my care of the patient were reviewed by me and considered in my medical decision making (see below for details).     CT to exclude renal or liver injury, also for evaluating for bony injury to the spine or ribs.  Laceration to the hand is already starting the healing process, not amenable for suture repair, already well washed by patient, tetanus up-to-date, will provide wound supplies.  Patient has a subconjunctival hematoma on the right, no pain, no vision loss, no other evaluation needed.  Awaiting imaging.  Signed out to oncoming provider at shift change.    Elmer Sow. Pilar Plate, MD [facility] Emergency Medicine [facility] [email or web address]  Final Clinical Impressions(s) / ED Diagnoses     ICD-10-CM   1. Subconjunctival hemorrhage of right eye  H11.31   2. Laceration of right hand without foreign body, initial encounter  S61.411A   3. Fall, initial encounter  W19.XXXA   4. Flank pain  R10.9     ED Discharge Orders    None       Discharge Instructions Discussed with and Provided to Patient:   Discharge Instructions   None       Sabas Sous, MD 08/27/19 2243

## 2019-08-27 NOTE — ED Triage Notes (Signed)
Patient reports fall off of truck bed yesterday. Denies head injury and LOC. Reports right rib pain and headache.

## 2019-08-28 NOTE — ED Provider Notes (Signed)
Patient signed out to me by Dr. Pilar Plate to follow-up on CT scan.  Patient complaining of side and flank pain after a fall.  CT chest abdomen and pelvis has been performed and there is no injury seen.  Patient will be appropriate for discharge.   Gilda Crease, MD 08/28/19 [phone]

## 2019-08-28 NOTE — ED Notes (Signed)
Pt became very upset with the staff due to visitor policy and demanded to be discharged. MD aware.

## 2019-12-06 ENCOUNTER — Encounter (HOSPITAL_COMMUNITY): Payer: Self-pay | Admitting: Emergency Medicine

## 2019-12-06 ENCOUNTER — Emergency Department (HOSPITAL_COMMUNITY)
Admission: EM | Admit: 2019-12-06 | Discharge: 2019-12-06 | Disposition: A | Discharge status: Home / Self Care | Payer: Self-pay | Attending: Emergency Medicine | Admitting: Emergency Medicine

## 2019-12-06 ENCOUNTER — Other Ambulatory Visit: Payer: Self-pay

## 2019-12-06 DIAGNOSIS — K047 Periapical abscess without sinus: Secondary | ICD-10-CM

## 2019-12-06 DIAGNOSIS — K029 Dental caries, unspecified: Secondary | ICD-10-CM

## 2019-12-06 DIAGNOSIS — R030 Elevated blood-pressure reading, without diagnosis of hypertension: Secondary | ICD-10-CM

## 2019-12-06 DIAGNOSIS — M25511 Pain in right shoulder: Secondary | ICD-10-CM | POA: Insufficient documentation

## 2019-12-06 DIAGNOSIS — M25512 Pain in left shoulder: Secondary | ICD-10-CM | POA: Insufficient documentation

## 2019-12-06 DIAGNOSIS — F17210 Nicotine dependence, cigarettes, uncomplicated: Secondary | ICD-10-CM | POA: Insufficient documentation

## 2019-12-06 DIAGNOSIS — K0889 Other specified disorders of teeth and supporting structures: Secondary | ICD-10-CM | POA: Insufficient documentation

## 2019-12-06 DIAGNOSIS — S46819A Strain of other muscles, fascia and tendons at shoulder and upper arm level, unspecified arm, initial encounter: Secondary | ICD-10-CM

## 2019-12-06 MED ORDER — METHOCARBAMOL 750 MG PO TABS: 750.0000 mg | ORAL_TABLET | Freq: Three times a day (TID) | ORAL | 0 refills | Status: AC | PRN

## 2019-12-06 MED ORDER — ACETAMINOPHEN 500 MG PO TABS
1000.0000 mg | ORAL_TABLET | Freq: Once | ORAL | Status: AC
  Administered 2019-12-06: 1000 mg via ORAL
  Filled 2019-12-06: qty 2

## 2019-12-06 MED ORDER — AMOXICILLIN 500 MG PO CAPS: 500.0000 mg | ORAL_CAPSULE | Freq: Three times a day (TID) | ORAL | 0 refills | Status: DC

## 2019-12-06 MED ORDER — AMOXICILLIN 500 MG PO CAPS
500.0000 mg | ORAL_CAPSULE | Freq: Once | ORAL | Status: AC
  Administered 2019-12-06: 500 mg via ORAL
  Filled 2019-12-06: qty 1

## 2019-12-06 NOTE — Discharge Instructions (Addendum)
It was our pleasure to provide your ER care today - we hope that you feel better.  Take amoxicillin as prescribed. Take acetaminophen or ibuprofen as need for pain. You may also take robaxin as need for muscle pain/spasm - no driving when taking. Try heat therapy to upper back/trapezius area.   For dental pain, follow up with dentist in the next couple days - call office in AM tomorrow to arrange follow up.  Your blood pressure is high tonight - follow up with primary care doctor in 1-2 weeks.   Return to ER if worse, new symptoms, fevers, severe swelling, severe or intractable pain, or other concern.

## 2019-12-06 NOTE — ED Provider Notes (Signed)
[facility] DEPT Provider Note   CSN: [id number] Arrival date & time: 12/06/19  2007     History Chief Complaint  Patient presents with  . Dental Pain    on right bottom teeth.  . Back Pain    Steven Cobb is a 59 y.o. male.  Patient c/o right lower dental pain, and bilateral trapezius muscular pain/soreness. Dental symptoms acute onset in past day, constant, dull, moderate, right lower, non radiating. Has no local dentist. Denies facial or neck pain or swelling. No fever or chills. 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 Denies midline/neck pain or any radicular pain. No numbness or weakness.   The history is provided by the patient.  Dental Pain Associated symptoms: no fever, no headaches and no neck pain   Back Pain Associated symptoms: no abdominal pain, no chest pain, no fever and no headaches        History reviewed. No pertinent past medical history.  There are no problems to display for this patient.   Past Surgical History:  Procedure Laterality Date  . SHOULDER SURGERY         Family History  Problem Relation Age of Onset  . Coronary artery disease Mother   . Coronary artery disease Father     Social History   Tobacco Use  . Smoking status: Current Every Day Smoker    Packs/day: 0.45    Types: Cigarettes  . Smokeless tobacco: Never Used  Substance Use Topics  . Alcohol use: No    Comment: occassionally   . Drug use: Yes    Types: Marijuana    Home Medications Prior to Admission medications   Medication Sig Start Date End Date Taking? Authorizing Provider  albuterol (PROVENTIL HFA;VENTOLIN HFA) 108 (90 Base) MCG/ACT inhaler Inhale 2 puffs into the lungs every 4 (four) hours as needed for wheezing or shortness of breath. Patient not taking: Reported on 08/27/2019 08/25/18   Joy,  Raquel Sarna C, PA-C  benzonatate (TESSALON) 100 MG capsule Take 1 capsule (100 mg total) by mouth 2 (two) times daily as needed for cough. Patient not taking: Reported on 08/27/2019 06/19/18   Argentina Donovan, PA-C  fluticasone [facility]) 50 MCG/ACT nasal spray Place 2 sprays into both nostrils daily. Patient not taking: Reported on 08/27/2019 08/25/18   Joy, Helane Gunther, PA-C  ibuprofen (ADVIL,MOTRIN) 600 MG tablet Take 1 tablet (600 mg total) by mouth every 8 (eight) hours as needed. For pain; take after eating Patient not taking: Reported on 08/27/2019 04/22/18   Fulp, Ander Gaster, MD  loperamide (IMODIUM) 2 MG capsule Take 1 capsule (2 mg total) by mouth 4 (four) times daily as needed for diarrhea or loose stools. Patient not taking: Reported on 08/27/2019 10/20/18   Hedges, Dellis Filbert, PA-C  omeprazole (PRILOSEC) 20 MG capsule Take 1 capsule (20 mg total) by mouth 2 (two) times daily. To reduce stomach acid Patient not taking: Reported on 08/27/2019 04/22/18   Fulp, Cammie, MD  ondansetron (ZOFRAN) 4 MG tablet Take 1 tablet (4 mg total) by mouth every 6 (six) hours. Patient not taking: Reported on 08/27/2019 10/20/18   Hedges, Dellis Filbert, PA-C  predniSONE (STERAPRED UNI-PAK 21 TAB) 10 MG (21) TBPK tablet Take 6 tabs (60mg ) day 1, 5 tabs (50mg ) day 2, 4 tabs (40mg ) day 3, 3 tabs (30mg ) day 4, 2 tabs (20mg ) day 5, and 1 tab (  10mg ) day 6. Patient not taking: Reported on 08/27/2019 08/25/18   10/25/18, PA-C  ranitidine (ZANTAC) 150 MG capsule Take 1 capsule (150 mg total) by mouth daily. Patient not taking: Reported on 08/27/2019 10/20/18   12/20/18, PA-C    Allergies    Sulfa antibiotics  Review of Systems   Review of Systems  Constitutional: Negative for fever.  HENT: Positive for dental problem. Negative for sore throat.   Eyes: Negative for redness.  Respiratory: Negative for shortness of breath.   Cardiovascular: Negative for chest pain.  Gastrointestinal: Negative for abdominal pain.  Genitourinary: Negative  for flank pain.  Musculoskeletal: Positive for back pain. Negative for neck pain.  Skin: Negative for rash.  Neurological: Negative for headaches.  Hematological: Does not bruise/bleed easily.  Psychiatric/Behavioral: Negative for confusion.    Physical Exam Updated Vital Signs BP (!) 150/99 (BP Location: Left Arm)   Pulse 80   Temp 98.3 F (36.8 C) (Oral)   Resp 18   Ht 1.727 m (5\' 8" )   Wt 78.5 kg   SpO2 99%   BMI 26.30 kg/m   Physical Exam Vitals and nursing note reviewed.  Constitutional:      General: He is not in acute distress.    Appearance: Normal appearance. He is well-developed.  HENT:     Head: Atraumatic.     Nose: Nose normal.     Mouth/Throat:     Mouth: Mucous membranes are moist.     Pharynx: Oropharynx is clear.     Comments: Very few remaining teeth, and those that remain are largely decayed/broken. Right lower tooth that appears chronically decayed/broken off near gumline, with associated gum swelling and tenderness. No trismus. No pain, swelling or tenderness to floor of mouth or neck. Pharynx normal.  Eyes:     General: No scleral icterus.    Conjunctiva/sclera: Conjunctivae normal.     Pupils: Pupils are equal, round, and reactive to light.  Neck:     Trachea: No tracheal deviation.  Cardiovascular:     Rate and Rhythm: Normal rate.     Pulses: Normal pulses.  Pulmonary:     Effort: Pulmonary effort is normal. No accessory muscle usage or respiratory distress.  Genitourinary:    Comments: No cva tenderness. Musculoskeletal:        General: No swelling. Normal range of motion.     Cervical back: Normal range of motion and neck supple. No rigidity.     Comments: C/T spine non tender, aligned. Bilateral trapezius muscular tenderness/spasm.   Lymphadenopathy:     Cervical: No cervical adenopathy.  Skin:    General: Skin is warm and dry.     Findings: No rash.  Neurological:     Mental Status: He is alert and oriented to person, place, and  time.     Comments: Alert, speech clear. Motor/sens grossly intact bil, steady gait.   Psychiatric:        Mood and Affect: Mood normal.     ED Results / Procedures / Treatments   Labs (all labs ordered are listed, but only abnormal results are displayed) Labs Reviewed - No data to display  EKG None  Radiology No results found.  Procedures Procedures (including critical care time)  Medications Ordered in ED Medications  acetaminophen (TYLENOL) tablet 1,000 mg (has no administration in time range)  amoxicillin (AMOXIL) capsule 500 mg (has no administration in time range)    ED Course  I have reviewed the triage  vital signs and the nursing notes.  Pertinent labs & imaging results that were available during my care of the patient were reviewed by me and considered in my medical decision making (see chart for details).    MDM Rules/Calculators/A&P                          Exam c/w dental pain/abscess, and trapezius muscular strain/spasm.   Reviewed nursing notes and prior charts for additional history.   Confirmed w pt only allergy is sulfa abx.   Acetaminophen po. amox po.   rx robaxin, amox for home. Ibuprofen or acetaminophen prn. Heat therapy.   Rec dental f/u.  Return precautions provided.    Final Clinical Impression(s) / ED Diagnoses Final diagnoses:  None    Rx / DC Orders ED Discharge Orders    None       Cathren Laine, MD 12/06/19 2129

## 2020-06-11 ENCOUNTER — Other Ambulatory Visit: Payer: Self-pay

## 2020-06-11 ENCOUNTER — Emergency Department (HOSPITAL_COMMUNITY)
Admission: EM | Admit: 2020-06-11 | Discharge: 2020-06-11 | Disposition: A | Discharge status: Home / Self Care | Payer: Self-pay | Attending: Emergency Medicine | Admitting: Emergency Medicine

## 2020-06-11 ENCOUNTER — Encounter (HOSPITAL_COMMUNITY): Payer: Self-pay

## 2020-06-11 DIAGNOSIS — J069 Acute upper respiratory infection, unspecified: Secondary | ICD-10-CM | POA: Insufficient documentation

## 2020-06-11 DIAGNOSIS — Z20822 Contact with and (suspected) exposure to covid-19: Secondary | ICD-10-CM | POA: Insufficient documentation

## 2020-06-11 DIAGNOSIS — F17210 Nicotine dependence, cigarettes, uncomplicated: Secondary | ICD-10-CM | POA: Insufficient documentation

## 2020-06-11 LAB — RESP PANEL BY RT-PCR (FLU A&B, COVID) ARPGX2
Influenza A by PCR: NEGATIVE
Influenza B by PCR: NEGATIVE
SARS Coronavirus 2 by RT PCR: NEGATIVE

## 2020-06-11 MED ORDER — BENZONATATE 100 MG PO CAPS: 100.0000 mg | ORAL_CAPSULE | Freq: Three times a day (TID) | ORAL | 0 refills | Status: AC

## 2020-06-11 NOTE — Discharge Instructions (Signed)
Like we discussed, I am prescribing you a cough medication called Tessalon Perles. You can take this up to 3 times a day for management of your cough symptoms. I would recommend you continue taking Tussin at night since it appears to help you sleep.  Please check the results of your COVID-19 test on MyChart. If positive, you need to quarantine for 10 to 14 days from onset of your symptoms. 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  You can always return to the ER with any new or worsening symptoms. It was a pleasure to meet you.

## 2020-06-11 NOTE — ED Notes (Signed)
Pt removed self from monitor, standing in hall, refuses discharge vitals.

## 2020-06-11 NOTE — ED Triage Notes (Signed)
Patient coming from home, reports he has had a cold and he blew his nose earlier and there is thick blood, denies blood thinners, reports he does not take any medications at this time. States the cold has been going on for 4 days and he has been coughing and sneezing, denies any fever. Patient has had his COVID vaccinations.

## 2020-06-11 NOTE — ED Notes (Signed)
Patient verbalized understanding of discharge instructions. Opportunity for questions and answers.  

## 2020-06-11 NOTE — ED Provider Notes (Signed)
[facility] [address] [facility] EMERGENCY DEPARTMENT Provider Note   CSN: [id number] Arrival date & time: 06/11/20  0435     History Chief Complaint  Patient presents with  . Epistaxis    Steven Cobb is a 60 y.o. male.  HPI Patient is a 59 year old male who presents the emergency department with a cough. Symptoms started a few days ago. Reports associated rhinorrhea, congestion, sore throat. Has been taking Tussin with moderate relief. Also states he has been drinking bourbon for his sore throat with moderate relief. Denies fevers, chest pain, shortness of breath, nausea, vomiting, diarrhea. He has been vaccinated for COVID-19 but has not received the booster. He notes this morning that he experienced an episode of epistaxis after blowing his nose. This resolved without intervention. He is not anticoagulated.    History reviewed. No pertinent past medical history.  There are no problems to display for this patient.   Past Surgical History:  Procedure Laterality Date  . SHOULDER SURGERY         Family History  Problem Relation Age of Onset  . Coronary artery disease Mother   . Coronary artery disease Father     Social History   Tobacco Use  . Smoking status: Current Every Day Smoker    Packs/day: 0.45    Types: Cigarettes  . Smokeless tobacco: Never Used  Substance Use Topics  . Alcohol use: No    Comment: occassionally   . Drug use: Yes    Types: Marijuana    Home Medications Prior to Admission medications   Medication Sig Start Date End Date Taking? Authorizing Provider  albuterol (PROVENTIL HFA;VENTOLIN HFA) 108 (90 Base) MCG/ACT inhaler Inhale 2 puffs into the lungs every 4 (four) hours as needed for wheezing or shortness of breath. Patient not taking: Reported on 08/27/2019 08/25/18   Joy, Ines Bloomer C, PA-C  amoxicillin (AMOXIL) 500 MG capsule Take 1 capsule (500 mg total) by mouth 3 (three) times daily. 12/06/19   Cathren Laine, MD  benzonatate (TESSALON)  100 MG capsule Take 1 capsule (100 mg total) by mouth 2 (two) times daily as needed for cough. Patient not taking: Reported on 08/27/2019 06/19/18   Anders Simmonds, PA-C  fluticasone [facility]) 50 MCG/ACT nasal spray Place 2 sprays into both nostrils daily. Patient not taking: Reported on 08/27/2019 08/25/18   Joy, Hillard Danker, PA-C  ibuprofen (ADVIL,MOTRIN) 600 MG tablet Take 1 tablet (600 mg total) by mouth every 8 (eight) hours as needed. For pain; take after eating Patient not taking: Reported on 08/27/2019 04/22/18   Fulp, Hewitt Shorts, MD  loperamide (IMODIUM) 2 MG capsule Take 1 capsule (2 mg total) by mouth 4 (four) times daily as needed for diarrhea or loose stools. Patient not taking: Reported on 08/27/2019 10/20/18   Hedges, Tinnie Gens, PA-C  methocarbamol (ROBAXIN) 750 MG tablet Take 1 tablet (750 mg total) by mouth 3 (three) times daily as needed (muscle spasm/pain). 12/06/19   Cathren Laine, MD  omeprazole (PRILOSEC) 20 MG capsule Take 1 capsule (20 mg total) by mouth 2 (two) times daily. To reduce stomach acid Patient not taking: Reported on 08/27/2019 04/22/18   Fulp, Cammie, MD  ondansetron (ZOFRAN) 4 MG tablet Take 1 tablet (4 mg total) by mouth every 6 (six) hours. Patient not taking: Reported on 08/27/2019 10/20/18   Hedges, Tinnie Gens, PA-C  predniSONE (STERAPRED UNI-PAK 21 TAB) 10 MG (21) TBPK tablet Take 6 tabs (60mg ) day 1, 5 tabs (50mg ) day 2, 4 tabs (40mg ) day 3, 3  tabs (30mg ) day 4, 2 tabs (20mg ) day 5, and 1 tab (10mg ) day 6. Patient not taking: Reported on 08/27/2019 08/25/18   , PA-C  ranitidine (ZANTAC) 150 MG capsule Take 1 capsule (150 mg total) by mouth daily. Patient not taking: Reported on 08/27/2019 10/20/18   Anselm Pancoast, PA-C    Allergies    Sulfa antibiotics  Review of Systems   Review of Systems  Constitutional: Negative for chills and fever.  HENT: Positive for congestion, postnasal drip, rhinorrhea, sneezing and sore throat. Negative for ear discharge, ear pain and  trouble swallowing.   Respiratory: Positive for cough. Negative for shortness of breath and wheezing.   Cardiovascular: Negative for chest pain.  Gastrointestinal: Negative for abdominal pain, diarrhea, nausea and vomiting.   Physical Exam Updated Vital Signs BP 132/84 (BP Location: Right Arm)   Pulse 83   Temp 98.5 F (36.9 C) (Oral)   Resp 18   Ht 5\' 8"  (1.727 m)   Wt 78.5 kg   SpO2 97%   BMI 26.31 kg/m   Physical Exam Vitals and nursing note reviewed.  Constitutional:      General: He is not in acute distress.    Appearance: Normal appearance. He is normal weight. He is not ill-appearing, toxic-appearing or diaphoretic.  HENT:     Head: Normocephalic and atraumatic.     Right Ear: External ear normal.     Left Ear: External ear normal.     Nose: Congestion present.     Mouth/Throat:     Mouth: Mucous membranes are moist.     Pharynx: Oropharynx is clear. No oropharyngeal exudate or posterior oropharyngeal erythema.     Comments: Uvula midline. No significant erythema noted in the posterior oropharynx. No hot potato voice. Readily handling secretions. Eyes:     General: No scleral icterus.       Right eye: No discharge.        Left eye: No discharge.     Extraocular Movements: Extraocular movements intact.     Conjunctiva/sclera: Conjunctivae normal.  Cardiovascular:     Rate and Rhythm: Normal rate and regular rhythm.     Pulses: Normal pulses.     Heart sounds: Normal heart sounds. No murmur heard. No friction rub. No gallop.      Comments: Regular rate and rhythm without murmurs, rubs, or gallops. Pulmonary:     Effort: Pulmonary effort is normal. No respiratory distress.     Breath sounds: Normal breath sounds. No stridor. No wheezing, rhonchi or rales.     Comments: Lungs are clear to auscultation bilaterally. No wheezing, rales, or rhonchi. Abdominal:     General: Abdomen is flat.     Tenderness: There is no abdominal tenderness.  Musculoskeletal:         General: Normal range of motion.     Cervical back: Normal range of motion and neck supple. No tenderness.  Skin:    General: Skin is warm and dry.  Neurological:     General: No focal deficit present.     Mental Status: He is alert and oriented to person, place, and time.  Psychiatric:        Mood and Affect: Mood normal.        Behavior: Behavior normal.    ED Results / Procedures / Treatments   Labs (all labs ordered are listed, but only abnormal results are displayed) Labs Reviewed - No data to display  EKG None  Radiology No results  found.  Procedures Procedures (including critical care time)  Medications Ordered in ED Medications - No data to display  ED Course  I have reviewed the triage vital signs and the nursing notes.  Pertinent labs & imaging results that were available during my care of the patient were reviewed by me and considered in my medical decision making (see chart for details).    MDM Rules/Calculators/A&P                          Patient is a 59 year old male who presents the emergency department with symptoms consistent with a viral URI. He has been vaccinated for COVID-19 but requests a new COVID-19 test today. He is planning to check the results on MyChart. Has been taking Tussin with relief of his cough but states it is sedating. Will provide a course of Tessalon Perles for use during the day. He works as a Naval architect. Patient has some mild congestion on my exam but otherwise a physical exam is reassuring. No signs of epistaxis. Lungs are clear to auscultation bilaterally. Recommended checking his COVID-19 test results on MyChart. Returning to the emergency department with new or worsening symptoms. His questions were answered and he was amicable at the time of discharge. His vital signs are stable.  Final Clinical Impression(s) / ED Diagnoses Final diagnoses:  Viral URI with cough   Rx / DC Orders ED Discharge Orders         Ordered     benzonatate (TESSALON) 100 MG capsule  Every 8 hours        06/11/20 0509           Placido Sou, PA-C 06/11/20 3419    Marily Memos, MD 06/11/20 2257

## 2020-06-20 ENCOUNTER — Ambulatory Visit: Payer: Self-pay | Admitting: Podiatry

## 2020-11-14 ENCOUNTER — Encounter (HOSPITAL_COMMUNITY): Payer: Self-pay | Admitting: Emergency Medicine

## 2020-11-14 ENCOUNTER — Other Ambulatory Visit: Payer: Self-pay

## 2020-11-14 ENCOUNTER — Emergency Department (HOSPITAL_COMMUNITY)
Admission: EM | Admit: 2020-11-14 | Discharge: 2020-11-14 | Disposition: A | Discharge status: Home / Self Care | Payer: Self-pay | Attending: Emergency Medicine | Admitting: Emergency Medicine

## 2020-11-14 DIAGNOSIS — R599 Enlarged lymph nodes, unspecified: Secondary | ICD-10-CM | POA: Insufficient documentation

## 2020-11-14 DIAGNOSIS — K047 Periapical abscess without sinus: Secondary | ICD-10-CM | POA: Insufficient documentation

## 2020-11-14 DIAGNOSIS — F17210 Nicotine dependence, cigarettes, uncomplicated: Secondary | ICD-10-CM | POA: Insufficient documentation

## 2020-11-14 MED ORDER — CLINDAMYCIN HCL 150 MG PO CAPS: 450.0000 mg | ORAL_CAPSULE | Freq: Three times a day (TID) | ORAL | 0 refills | Status: AC

## 2020-11-14 MED ORDER — CLINDAMYCIN HCL 300 MG PO CAPS
450.0000 mg | ORAL_CAPSULE | Freq: Once | ORAL | Status: AC
  Administered 2020-11-14: 450 mg via ORAL
  Filled 2020-11-14: qty 1

## 2020-11-14 NOTE — Discharge Instructions (Signed)
Please pick up prescription and take as prescribed to cover for infection.  Keep your appointment with your dentist as scheduled  If you do not see any improvement in 48 hours or you begin experiencing worsening swelling, inability to swallow your own spit, drooling on yourself, or any other new/worsening symptoms go to a local ED IMMEDIATELY for further evaluation

## 2020-11-14 NOTE — ED Provider Notes (Signed)
[facility]-EMERGENCY DEPT Provider Note   CSN: [id number] Arrival date & time: 11/14/20  1732     History Chief Complaint  Patient presents with  . Dental Pain    Steven Cobb is a 60 y.o. male who presents to the ED today with complaint of sudden onset, constant, worsening, right lower dental pain x 3 days. Pt is a [employer]. He reports he was in [place] a couple of days ago eating seafood when he had immediate pain to his right lower teeth. He believes he may have bitten into a shell of some sort causing pain. Pt states that since then he has had some left sided facial swelling causing pain with swallowing. He has an appointment with a dentist in [place] in 2 weeks however was told he could not be seen if his face was swollen and needed to be evaluated in an ED prompting him to come here today. Pt denies fevers or chills.   The history is provided by the patient and medical records.       History reviewed. No pertinent past medical history.  There are no problems to display for this patient.   Past Surgical History:  Procedure Laterality Date  . SHOULDER SURGERY         Family History  Problem Relation Age of Onset  . Coronary artery disease Mother   . Coronary artery disease Father     Social History   Tobacco Use  . Smoking status: Current Every Day Smoker    Packs/day: 0.45    Types: Cigarettes  . Smokeless tobacco: Never Used  Substance Use Topics  . Alcohol use: No    Comment: occassionally   . Drug use: Yes    Types: Marijuana    Home Medications Prior to Admission medications   Medication Sig Start Date End Date Taking? Authorizing Provider  clindamycin (CLEOCIN) 150 MG capsule Take 3 capsules (450 mg total) by mouth 3 (three) times daily for 10 days. 11/14/20 11/24/20 Yes Tahje Borawski, PA-C  albuterol (PROVENTIL HFA;VENTOLIN HFA) 108 (90 Base) MCG/ACT inhaler Inhale 2 puffs into the lungs every 4 (four) hours as  needed for wheezing or shortness of breath. Patient not taking: Reported on 08/27/2019 08/25/18   Joy, Ines Bloomer C, PA-C  amoxicillin (AMOXIL) 500 MG capsule Take 1 capsule (500 mg total) by mouth 3 (three) times daily. 12/06/19   Cathren Laine, MD  benzonatate (TESSALON) 100 MG capsule Take 1 capsule (100 mg total) by mouth every 8 (eight) hours. 06/11/20   Placido Sou, PA-C  fluticasone (FLONASE) 50 MCG/ACT nasal spray Place 2 sprays into both nostrils daily. Patient not taking: Reported on 08/27/2019 08/25/18   Joy, Hillard Danker, PA-C  ibuprofen (ADVIL,MOTRIN) 600 MG tablet Take 1 tablet (600 mg total) by mouth every 8 (eight) hours as needed. For pain; take after eating Patient not taking: Reported on 08/27/2019 04/22/18   Fulp, Hewitt Shorts, MD  loperamide (IMODIUM) 2 MG capsule Take 1 capsule (2 mg total) by mouth 4 (four) times daily as needed for diarrhea or loose stools. Patient not taking: Reported on 08/27/2019 10/20/18   Hedges, Tinnie Gens, PA-C  methocarbamol (ROBAXIN) 750 MG tablet Take 1 tablet (750 mg total) by mouth 3 (three) times daily as needed (muscle spasm/pain). 12/06/19   Cathren Laine, MD  omeprazole (PRILOSEC) 20 MG capsule Take 1 capsule (20 mg total) by mouth 2 (two) times daily. To reduce stomach acid Patient not taking: Reported on 08/27/2019 04/22/18  Fulp, Cammie, MD  ondansetron (ZOFRAN) 4 MG tablet Take 1 tablet (4 mg total) by mouth every 6 (six) hours. Patient not taking: Reported on 08/27/2019 10/20/18   Hedges, Tinnie Gens, PA-C  predniSONE (STERAPRED UNI-PAK 21 TAB) 10 MG (21) TBPK tablet Take 6 tabs (60mg ) day 1, 5 tabs (50mg ) day 2, 4 tabs (40mg ) day 3, 3 tabs (30mg ) day 4, 2 tabs (20mg ) day 5, and 1 tab (10mg ) day 6. Patient not taking: Reported on 08/27/2019 08/25/18   , PA-C  ranitidine (ZANTAC) 150 MG capsule Take 1 capsule (150 mg total) by mouth daily. Patient not taking: Reported on 08/27/2019 10/20/18   , PA-C    Allergies    Sulfa antibiotics  Review of  Systems   Review of Systems  Constitutional: Negative for chills and fever.  HENT: Positive for dental problem and facial swelling.   All other systems reviewed and are negative.   Physical Exam Updated Vital Signs BP 122/83   Pulse 78   Temp 98.9 F (37.2 C) (Oral)   Resp 19   SpO2 96%   Physical Exam Vitals and nursing note reviewed.  Constitutional:      Appearance: He is not ill-appearing.  HENT:     Head: Normocephalic and atraumatic.     Mouth/Throat:     Comments: Poor dentition throughout with multiple missing teeth.  R lower tooth #29 decayed with caries with TTP, with minimal surrounding gingival swelling and erythema, no evidence of ludwig's.  Oropharynx clear and moist, without uvular swelling or deviation, no trismus or drooling, no tonsillar swelling or erythema, no exudates.    Eyes:     Conjunctiva/sclera: Conjunctivae normal.  Neck:     Comments: + right anterior cervical lymphadenopathy Cardiovascular:     Rate and Rhythm: Normal rate and regular rhythm.  Pulmonary:     Effort: Pulmonary effort is normal.     Breath sounds: Normal breath sounds.  Lymphadenopathy:     Cervical: Cervical adenopathy present.  Skin:    General: Skin is warm and dry.     Coloration: Skin is not jaundiced.  Neurological:     Mental Status: He is alert.     ED Results / Procedures / Treatments   Labs (all labs ordered are listed, but only abnormal results are displayed) Labs Reviewed - No data to display  EKG None  Radiology No results found.  Procedures Procedures   Medications Ordered in ED Medications  clindamycin (CLEOCIN) capsule 450 mg (450 mg Oral Given 11/14/20 1812)    ED Course  I have reviewed the triage vital signs and the nursing notes.  Pertinent labs & imaging results that were available during my care of the patient were reviewed by me and considered in my medical decision making (see chart for details).    MDM Rules/Calculators/A&P                           60 year old male who presents to the ED today with complaint of right lower dental pain for the past 2 to 3 days after biting down on a shell while eating seafood.  Has had some left-sided facial swelling since then.  On arrival to the ED today vitals are stable.  Patient is afebrile, nontachycardic and nontachypneic and appears to be in no acute distress.  On exam he has multiple missing teeth as well as poor dentition throughout.  He has some gingival irritation  and erythema to tooth #29.  He is also noted to have right anterior cervical lymphadenopathy.  He states that this area has slowly decreased in size however is bothering him and making it painful to swallow.  He is tolerating his own secretions without difficulty, airway patent.  Uvula is midline.  Prescribed antibiotics at this time.  Patient has an appointment with a dentist in [place] on 06/09.  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  I have advised that if he sees no improvement after 48 hours or if the swelling gets worse and he is no longer able to swallow his own spit or begins noticing voice change or other concerning symptoms he needs to go to a local ER where he is out immediately for further evaluation.  Patient is in agreement with plan and stable for discharge.   This note was prepared using Dragon voice recognition software and may include unintentional dictation errors due to the inherent limitations of voice recognition software.  Final Clinical Impression(s) / ED Diagnoses Final diagnoses:  Dental infection    Rx / DC Orders ED Discharge Orders         Ordered    clindamycin (CLEOCIN) 150 MG capsule  3 times daily        11/14/20 1813           Discharge Instructions     Please pick up prescription and take as prescribed to cover for infection.  Keep your appointment with your dentist as scheduled  If you do not see any improvement in 48 hours or you begin experiencing  worsening swelling, inability to swallow your own spit, drooling on yourself, or any other new/worsening symptoms go to a local ED IMMEDIATELY for further evaluation         Tanda Rockers, PA-C 11/14/20 1816    Rozelle Logan, DO 11/14/20 1951

## 2020-11-14 NOTE — ED Triage Notes (Addendum)
Pt arrives POV with complaints of a toothache and abscess on R side of mouth that he noticed 2 days ago. He reports pain with eating and drinking.

## 2020-11-30 ENCOUNTER — Ambulatory Visit: Payer: Self-pay | Admitting: Podiatry

## 2021-01-09 ENCOUNTER — Other Ambulatory Visit: Payer: Self-pay

## 2021-01-09 ENCOUNTER — Emergency Department (HOSPITAL_COMMUNITY)
Admission: EM | Admit: 2021-01-09 | Discharge: 2021-01-09 | Disposition: A | Discharge status: Home / Self Care | Payer: Self-pay | Attending: Emergency Medicine | Admitting: Emergency Medicine

## 2021-01-09 DIAGNOSIS — Z5321 Procedure and treatment not carried out due to patient leaving prior to being seen by health care provider: Secondary | ICD-10-CM | POA: Insufficient documentation

## 2021-01-09 DIAGNOSIS — M79606 Pain in leg, unspecified: Secondary | ICD-10-CM | POA: Insufficient documentation

## 2022-01-31 IMAGING — CT CT ABD-PELV W/ CM
2 of 5 series · 15 of 46 positions shown, 17 images · IV contrast (APPLIED)
Comparison: Portable chest earlier today.

CLINICAL DATA: 58-year-old male status post fall from truck
yesterday. Right rib pain.

EXAM:
CT ABDOMEN AND PELVIS WITH CONTRAST
TECHNIQUE: Multidetector CT imaging of the abdomen and pelvis was performed
using the standard protocol following bolus administration of
intravenous contrast.
CONTRAST:  100mL OMNIPAQUE IOHEXOL 300 MG/ML  SOLN

[Series 2: axial st · axial · 0.70mm/px · z∈[-676,-271]mm · 12 of 95 slices shown, 14 images]
[im 7/95  soft-tissue]
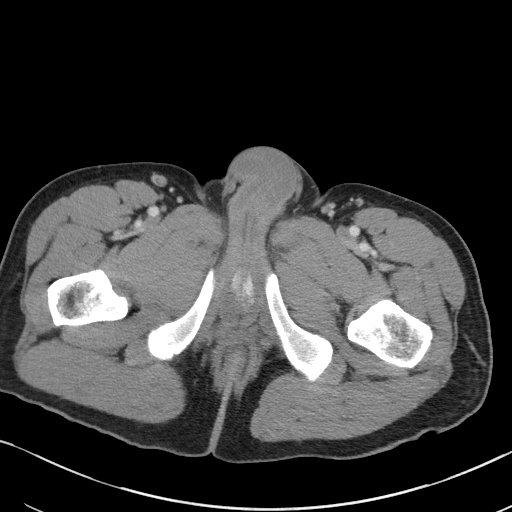
[im 7/95  bone]
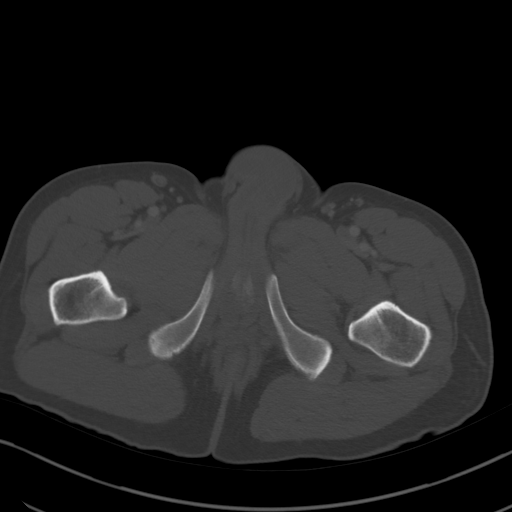
[im 14/95  soft-tissue]
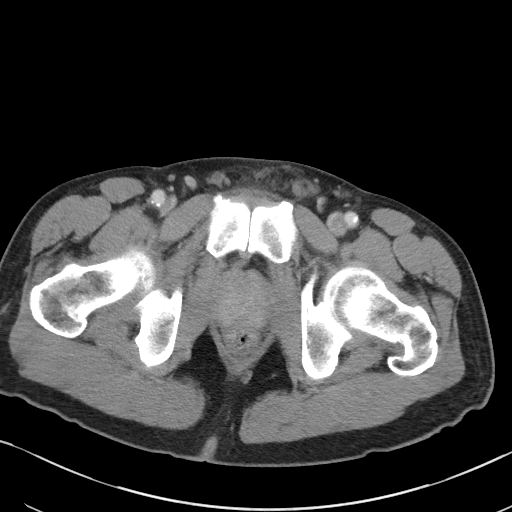
[im 21/95  soft-tissue]
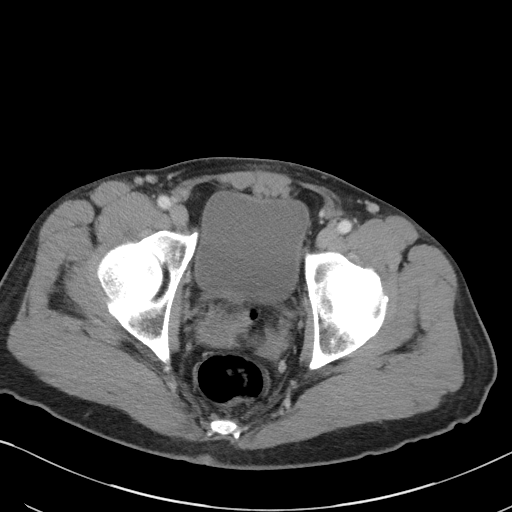
[im 27/95  soft-tissue]
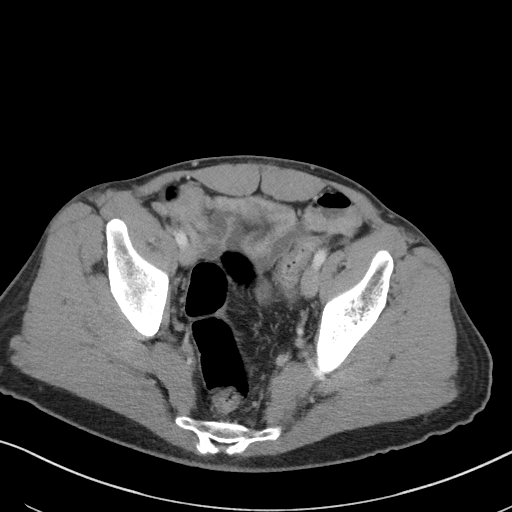
[im 34/95  soft-tissue]
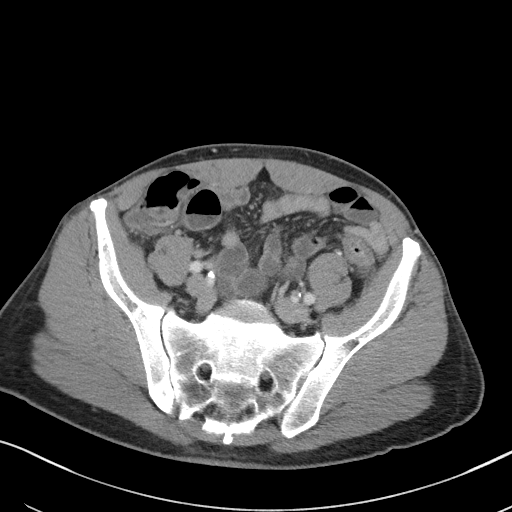
[im 41/95  soft-tissue]
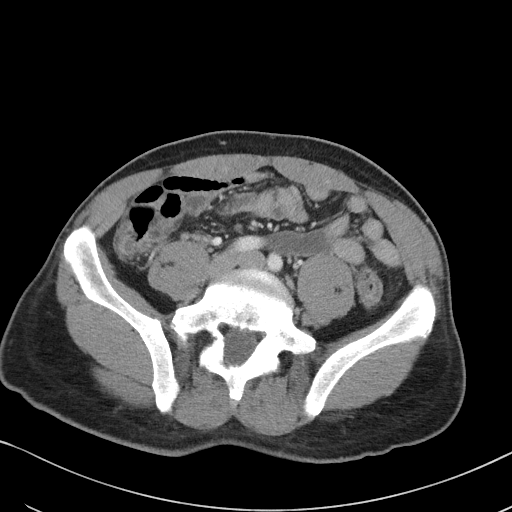
[im 54/95  soft-tissue]
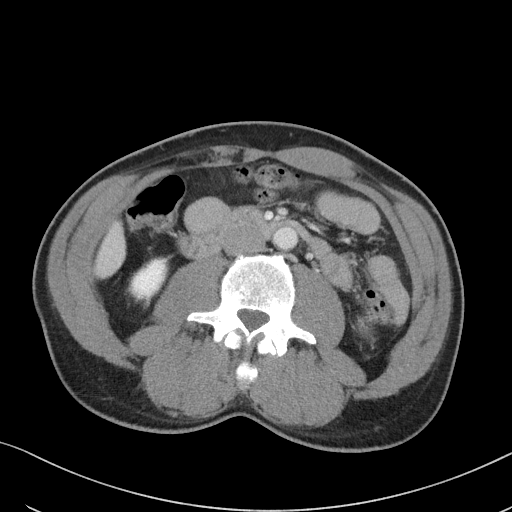
[im 61/95  soft-tissue]
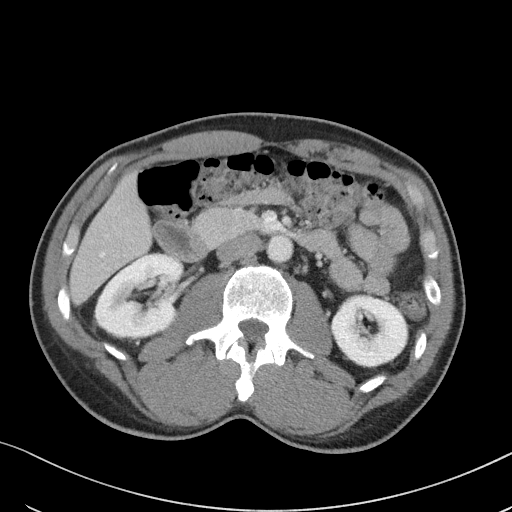
[im 68/95  soft-tissue]
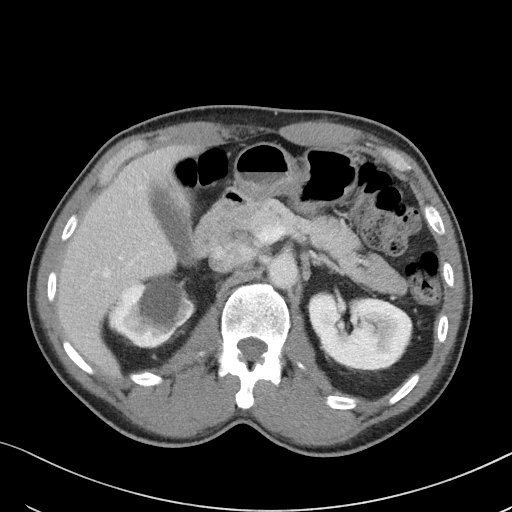
[im 68/95  bone]
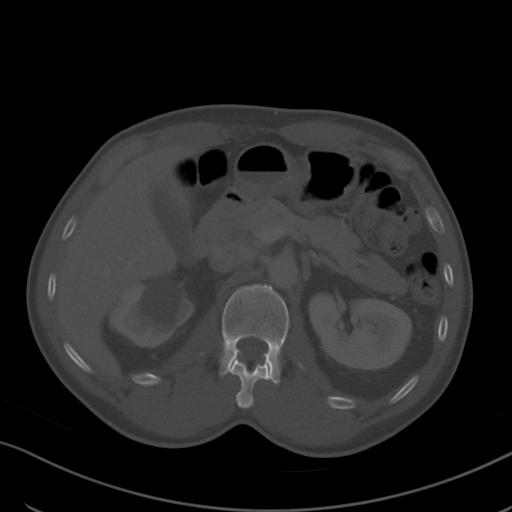
[im 74/95  soft-tissue]
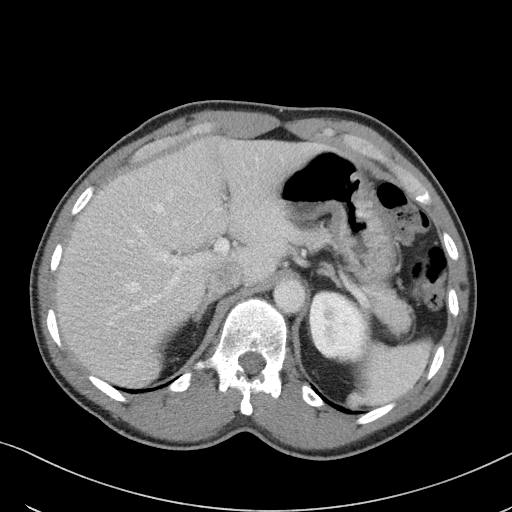
[im 81/95  soft-tissue]
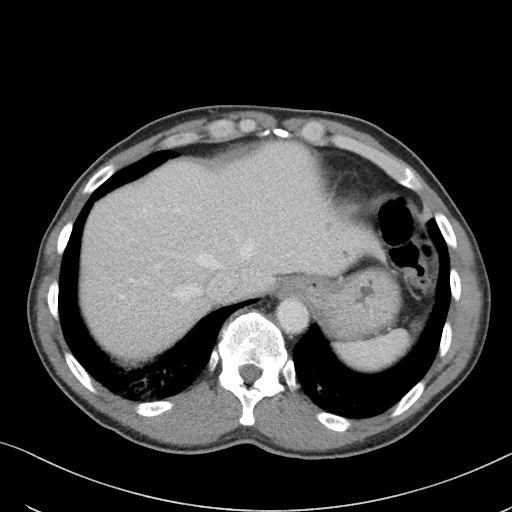
[im 88/95  soft-tissue]
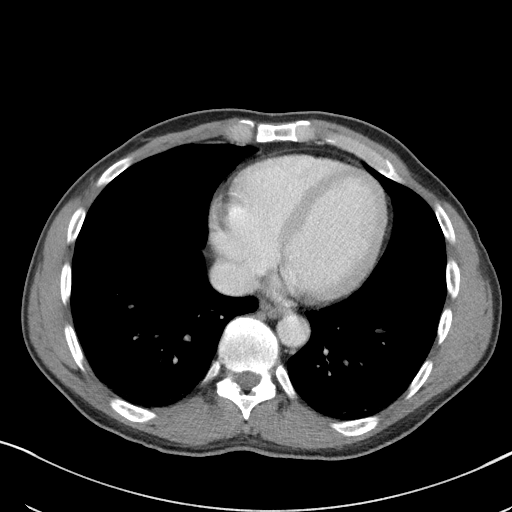

[Series 5: coronal st · coronal · 0.60mm/px · 3 of 79 slices shown]
[im 27/79  soft-tissue]
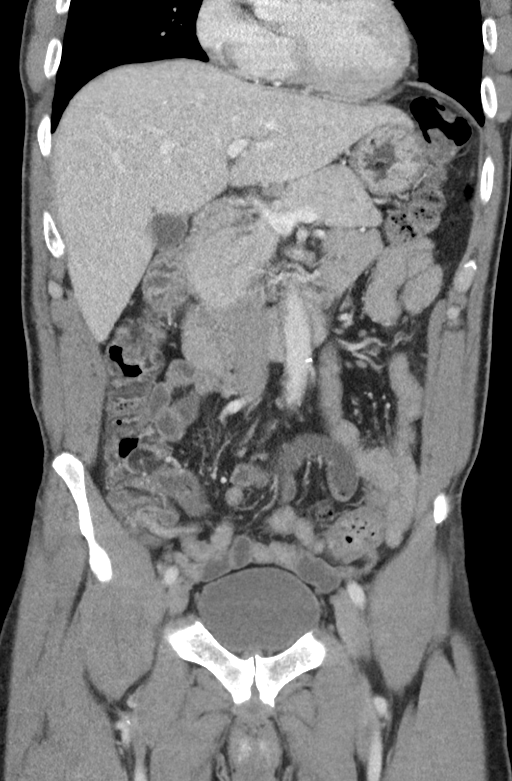
[im 35/79  soft-tissue]
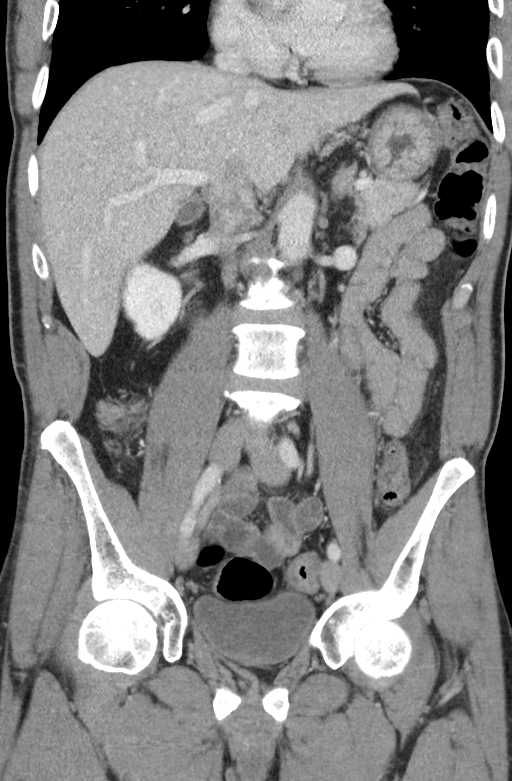
[im 44/79  soft-tissue]
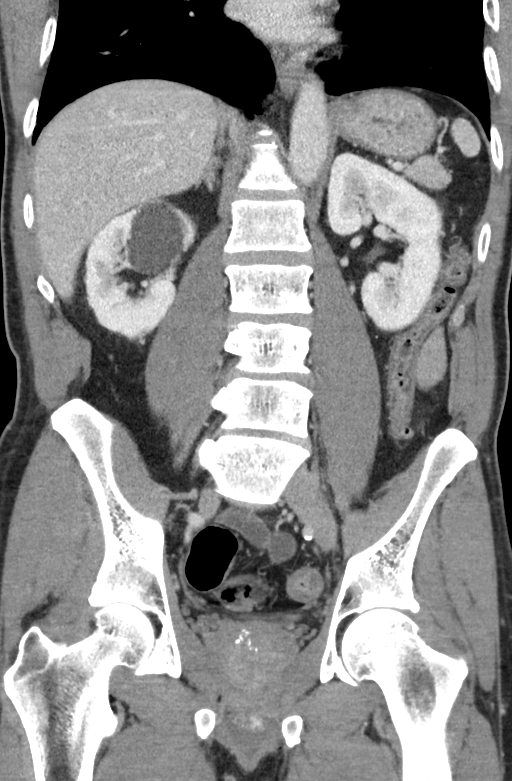

[15 of 46 positions shown; findings below may reference images not displayed]

FINDINGS: Lower chest: Dependent lower lung atelectasis. No lung base
pneumothorax, pleural effusion or pericardial effusion.

Hepatobiliary: Mild focal fat along the falciform ligament, normal
variant. Negative liver and gallbladder.

Pancreas: Negative.

Spleen: Negative. Incidental splenule, normal variant.

Adrenals/Urinary Tract: Normal adrenal glands. Benign parapelvic
right renal cysts. Otherwise symmetric and normal renal enhancement
and contrast excretion. There are also small benign lower pole renal
cysts. Decompressed proximal ureters. Diminutive and unremarkable
urinary bladder.

Stomach/Bowel: Negative descending and rectosigmoid colon. Negative
transverse colon aside from mild retained stool. Negative right
colon. Normal appendix visible on coronal image 33. Negative
terminal ileum.

No dilated small bowel. Decompressed stomach and duodenum. No free
air, free fluid.

Vascular/Lymphatic: Aortoiliac calcified atherosclerosis. Major
arterial structures are patent. Portal venous system is patent. No
lymphadenopathy.

Reproductive: Negative.

Other: No pelvic free fluid.

Musculoskeletal: No right lower rib fracture identified. Chronic
right L5 pars fracture without spondylolisthesis. Small and benign
appearing mixed lytic and sclerotic lesion in the posteromedial
iliac bone (series 2, image 63). No acute osseous abnormality
identified.

No superficial soft tissue injury identified. 87
IMPRESSION: 1. No acute traumatic injury identified in the visible lower chest,
abdomen, or pelvis.
2. Mild aortic atherosclerosis.

## 2022-05-17 ENCOUNTER — Emergency Department (HOSPITAL_COMMUNITY)
Admission: EM | Admit: 2022-05-17 | Discharge: 2022-05-17 | Discharge status: Against Medical Advice | Payer: Self-pay | Attending: Emergency Medicine | Admitting: Emergency Medicine

## 2022-05-17 ENCOUNTER — Emergency Department (HOSPITAL_COMMUNITY): Payer: Self-pay

## 2022-05-17 DIAGNOSIS — R0602 Shortness of breath: Secondary | ICD-10-CM | POA: Insufficient documentation

## 2022-05-17 DIAGNOSIS — R079 Chest pain, unspecified: Secondary | ICD-10-CM | POA: Insufficient documentation

## 2022-05-17 DIAGNOSIS — Z5321 Procedure and treatment not carried out due to patient leaving prior to being seen by health care provider: Secondary | ICD-10-CM | POA: Insufficient documentation

## 2022-05-17 LAB — BASIC METABOLIC PANEL
Anion gap: 5 (ref 5–15)
BUN: 13 mg/dL (ref 8–23)
CO2: 22 mmol/L (ref 22–32)
Calcium: 8.8 mg/dL — ABNORMAL LOW (ref 8.9–10.3)
Chloride: 111 mmol/L (ref 98–111)
Creatinine, Ser: 1.42 mg/dL — ABNORMAL HIGH (ref 0.61–1.24)
GFR, Estimated: 56 mL/min — ABNORMAL LOW (ref >60)
Glucose, Bld: 76 mg/dL (ref 70–99)
Potassium: 4 mmol/L (ref 3.5–5.1)
Sodium: 138 mmol/L (ref 135–145)

## 2022-05-17 LAB — CBC
HCT: 46.6 % (ref 39.0–52.0)
Hemoglobin: 15.2 g/dL (ref 13.0–17.0)
MCH: 31.2 pg (ref 26.0–34.0)
MCHC: 32.6 g/dL (ref 30.0–36.0)
MCV: 95.7 fL (ref 80.0–100.0)
Platelets: 255 10*3/uL (ref 150–400)
RBC: 4.87 MIL/uL (ref 4.22–5.81)
RDW: 15.3 % (ref 11.5–15.5)
WBC: 8 10*3/uL (ref 4.0–10.5)
nRBC: 0 % (ref 0.0–0.2)

## 2022-05-17 LAB — TROPONIN I (HIGH SENSITIVITY): Troponin I (High Sensitivity): 5 ng/L (ref <18)

## 2022-05-17 NOTE — ED Notes (Signed)
Patient called x3 with no response and not visible in the lobby. 

## 2022-05-17 NOTE — ED Provider Triage Note (Signed)
Emergency Medicine Provider Triage Evaluation Note  Steven Cobb , a 61 y.o. male  was evaluated in triage.  Pt complains of intermittent chest pain. Associated SOB at rest. No concerns while walking. Denies any other symptoms.  Review of Systems  Positive: As needed Negative: As needed  Physical Exam  BP 126/87 (BP Location: Right Arm)   Pulse 76   Temp 98.1 F (36.7 C)   Resp 16   SpO2 99%  Gen:   Awake, no distress   Resp:  Normal effort  MSK:   Moves extremities without difficulty  Other:    Medical Decision Making  Medically screening exam initiated at 4:25 PM.  Appropriate orders placed.  Steven Cobb was informed that the remainder of the evaluation will be completed by another provider, this initial triage assessment does not replace that evaluation, and the importance of remaining in the ED until their evaluation is complete.     Steven Knudsen, PA-C 05/17/22 1627

## 2022-05-17 NOTE — ED Triage Notes (Signed)
Patient complains of multiple five minute episodes of intermittent chest pain and bilateral hand tingling that started at 1000 today. Patient denies nausea, denies shortness of breath. Patient is alert, oriented, ambulating independently with steady gait and is in no apparent distress at this time.

## 2022-12-21 ENCOUNTER — Emergency Department (HOSPITAL_COMMUNITY)
Admission: EM | Admit: 2022-12-21 | Discharge: 2022-12-21 | Discharge status: Against Medical Advice | Payer: BLUE CROSS/BLUE SHIELD | Attending: Emergency Medicine | Admitting: Emergency Medicine

## 2022-12-21 ENCOUNTER — Ambulatory Visit (HOSPITAL_BASED_OUTPATIENT_CLINIC_OR_DEPARTMENT_OTHER)
Admission: RE | Admit: 2022-12-21 | Discharge: 2022-12-21 | Disposition: A | Discharge status: Home / Self Care | Payer: BLUE CROSS/BLUE SHIELD | Source: Ambulatory Visit | Attending: Emergency Medicine | Admitting: Emergency Medicine

## 2022-12-21 ENCOUNTER — Emergency Department (HOSPITAL_COMMUNITY): Payer: BLUE CROSS/BLUE SHIELD

## 2022-12-21 ENCOUNTER — Encounter (HOSPITAL_COMMUNITY): Payer: Self-pay

## 2022-12-21 DIAGNOSIS — M25562 Pain in left knee: Secondary | ICD-10-CM

## 2022-12-21 DIAGNOSIS — M7989 Other specified soft tissue disorders: Secondary | ICD-10-CM

## 2022-12-21 DIAGNOSIS — M25462 Effusion, left knee: Secondary | ICD-10-CM | POA: Insufficient documentation

## 2022-12-21 DIAGNOSIS — M25469 Effusion, unspecified knee: Secondary | ICD-10-CM

## 2022-12-21 NOTE — ED Provider Notes (Signed)
Steven Cobb AT [facility] Provider Note   CSN: [id number] Arrival date & time: 12/21/22  0405     History  Chief Complaint  Patient presents with   Joint Swelling    Steven Cobb is a 62 y.o. male who presents emergency [facility] with concerns for left knee swelling onset 2 days.  No recent fall, injury, trauma, heavy lifting.  Patient tried over-the-counter Tylenol ibuprofen at home for symptoms.  Denies calf pain, color change. Pt denies recent travel, immobilization, surgery, estrogen use, anticoagulant use, or PMHx of PE/DVT.   The history is provided by the patient. No language interpreter was used.       Home Medications Prior to Admission medications   Medication Sig Start Date End Date Taking? Authorizing Provider  albuterol (PROVENTIL HFA;VENTOLIN HFA) 108 (90 Base) MCG/ACT inhaler Inhale 2 puffs into the lungs every 4 (four) hours as needed for wheezing or shortness of breath. Patient not taking: Reported on 08/27/2019 08/25/18   Joy, Ines Bloomer C, PA-C  amoxicillin (AMOXIL) 500 MG capsule Take 1 capsule (500 mg total) by mouth 3 (three) times daily. 12/06/19   Cathren Laine, MD  benzonatate (TESSALON) 100 MG capsule Take 1 capsule (100 mg total) by mouth every 8 (eight) hours. 06/11/20   Placido Sou, PA-C  fluticasone (FLONASE) 50 MCG/ACT nasal spray Place 2 sprays into both nostrils daily. Patient not taking: Reported on 08/27/2019 08/25/18   Joy, Hillard Danker, PA-C  ibuprofen (ADVIL,MOTRIN) 600 MG tablet Take 1 tablet (600 mg total) by mouth every 8 (eight) hours as needed. For pain; take after eating Patient not taking: Reported on 08/27/2019 04/22/18   Fulp, Hewitt Shorts, MD  loperamide (IMODIUM) 2 MG capsule Take 1 capsule (2 mg total) by mouth 4 (four) times daily as needed for diarrhea or loose stools. Patient not taking: Reported on 08/27/2019 10/20/18   Hedges, Tinnie Gens, PA-C  methocarbamol (ROBAXIN) 750 MG tablet Take 1 tablet (750 mg total) by  mouth 3 (three) times daily as needed (muscle spasm/pain). 12/06/19   Cathren Laine, MD  omeprazole (PRILOSEC) 20 MG capsule Take 1 capsule (20 mg total) by mouth 2 (two) times daily. To reduce stomach acid Patient not taking: Reported on 08/27/2019 04/22/18   Fulp, Cammie, MD  ondansetron (ZOFRAN) 4 MG tablet Take 1 tablet (4 mg total) by mouth every 6 (six) hours. Patient not taking: Reported on 08/27/2019 10/20/18   Hedges, Tinnie Gens, PA-C  predniSONE (STERAPRED UNI-PAK 21 TAB) 10 MG (21) TBPK tablet Take 6 tabs (60mg ) day 1, 5 tabs (50mg ) day 2, 4 tabs (40mg ) day 3, 3 tabs (30mg ) day 4, 2 tabs (20mg ) day 5, and 1 tab (10mg ) day 6. Patient not taking: Reported on 08/27/2019 08/25/18   Anselm Pancoast, PA-C  ranitidine (ZANTAC) 150 MG capsule Take 1 capsule (150 mg total) by mouth daily. Patient not taking: Reported on 08/27/2019 10/20/18   Eyvonne Mechanic, PA-C      Allergies    Sulfa antibiotics    Review of Systems   Review of Systems  All other systems reviewed and are negative.   Physical Exam Updated Vital Signs BP (!) 131/97 (BP Location: Right Arm)   Pulse 78   Temp 97.9 F (36.6 C)   Resp 18   Wt 80.3 kg   SpO2 99%   BMI 26.91 kg/m  Physical Exam Vitals and nursing note reviewed.  Constitutional:      General: He is not in acute distress.  Appearance: Normal appearance.  Eyes:     General: No scleral icterus.    Extraocular Movements: Extraocular movements intact.  Cardiovascular:     Rate and Rhythm: Normal rate.  Pulmonary:     Effort: Pulmonary effort is normal. No respiratory distress.  Abdominal:     Palpations: Abdomen is soft. There is no mass.     Tenderness: There is no abdominal tenderness.  Musculoskeletal:        General: Normal range of motion.     Cervical back: Neck supple.     Comments: Patient able to flex and extend left knee against resistance without difficulty.  Pedal pulse intact.  No tenderness to palpation noted to left calf.  Some swelling noted to  left knee without overlying erythema.  No tenderness to palpation noted to left knee.  Mild tenderness to palpation noted to left medial popliteal region.  Skin:    General: Skin is warm and dry.     Findings: No rash.  Neurological:     Mental Status: He is alert.     Sensory: Sensation is intact.     Motor: Motor function is intact.  Psychiatric:        Behavior: Behavior normal.     ED Results / Procedures / Treatments   Labs (all labs ordered are listed, but only abnormal results are displayed) Labs Reviewed - No data to display  EKG None  Radiology DG Knee Complete 4 Views Left  Result Date: 12/21/2022 CLINICAL DATA:  Left knee pain for 2 days, atraumatic. EXAM: LEFT KNEE - COMPLETE 4 VIEW COMPARISON:  04/23/2018 FINDINGS: No evidence of fracture or dislocation. No joint space narrowing or spurring. Small knee joint effusion. IMPRESSION: Small knee joint effusion without acute osseous finding or degenerative spurring. Electronically Signed   By: Tiburcio Pea M.D.   On: 12/21/2022 05:51    Procedures Procedures    Medications Ordered in ED Medications - No data to display  ED Course/ Medical Decision Making/ A&P Clinical Course as of 12/21/22 0551  Fri Dec 21, 2022  1610 Discussed with patient that do we do not have overnight ultrasound to rule out a DVT at this time.  Discussed with patient through shared decision making that he can stay in the emergency [facility] until 7 AM to have the scan or we can order and he can come back in the morning.  Patient opts at this time to come back later in the morning to have his ultrasound to rule out a DVT. [SB]  [phone] Notified by RN that patient left from the ED after his x-ray stating that he will be back at 11 AM for his ultrasound. Pt ambulated without assistance or difficulty. [SB]    Clinical Course User Index [SB] Mordche Hedglin A, PA-C                             Medical Decision Making Amount and/or Complexity of Data  Reviewed Radiology: ordered.   Patient with left knee swelling x 2 days. No recent injury, trauma, fall. Pt afebrile. On exam, patient with Patient able to flex and extend left knee against resistance without difficulty.  Pedal pulse intact.  No tenderness to palpation noted to left calf.  Some swelling noted to left knee without overlying erythema.  No tenderness to palpation noted to left knee.  Mild tenderness to palpation noted to left medial popliteal region. Differential diagnosis includes fracture,  dislocation, effusion, DVT, cellulitis.    Imaging: I ordered imaging studies including left knee xray I independently visualized and interpreted imaging which showed:  Small knee joint effusion without acute osseous finding or  degenerative spurring.   I agree with the radiologist interpretation   Disposition: Presentation notable for effusion of left knee. Unable to obtain DVT US overnight. [place] study ordered for patient to obtain later today. Doubt concerns at this time for cellulitis, fracture, or dislocation. Pt eloped from the Emergency [facility] after obtaining his xray. Pt ambulatory without assistance or difficulty. Per RN, patient noted "I'm leaving, I will be back at 11 for my ultrasound."   This chart was dictated using voice recognition software, Dragon. Despite the best efforts of this provider to proofread and correct errors, errors may still occur which can change documentation meaning.  Final Clinical Impression(s) / ED Diagnoses Final diagnoses:  Knee swelling    Rx / DC Orders ED Discharge Orders     None         Alverda Nazzaro A, PA-C 12/21/22 0604    Shon Baton, MD 12/21/22 [phone]

## 2022-12-21 NOTE — Progress Notes (Signed)
Left lower extremity venous study completed.   Please see CV Procedures for preliminary results.  Fatema Rabe, RVT  11:23 AM 12/21/22

## 2022-12-21 NOTE — ED Notes (Signed)
Patient stated he was walking out and would be back at 11 for his ultrasound.

## 2022-12-21 NOTE — Discharge Instructions (Addendum)
It was a pleasure taking care of you!   Your x-ray was negative for fracture or dislocation.  You may take over the counter 500 mg Tylenol every 6 hours as needed for pain for no more than 7 days. You have an ultrasound of your left leg ordered that you may return to the Emergency Department later today to obtain to rule out blood clot to your leg.  You may apply ice or heat to affected area for up to 15 minutes at a time. Ensure to place a barrier between your skin and the ice/heat.  You may follow-up with your primary care provider as needed.  Return to the Emergency Department if you are experiencing increasing/worsening pain, swelling, color change, fever, or worsening symptoms.

## 2022-12-21 NOTE — ED Triage Notes (Signed)
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 No Hx of blood clots, no Rx for blood thinners.

## 2023-03-29 ENCOUNTER — Other Ambulatory Visit: Payer: Self-pay

## 2023-03-29 ENCOUNTER — Emergency Department (HOSPITAL_COMMUNITY)
Admission: EM | Admit: 2023-03-29 | Discharge: 2023-03-29 | Disposition: A | Discharge status: Home / Self Care | Payer: BLUE CROSS/BLUE SHIELD | Attending: Emergency Medicine | Admitting: Emergency Medicine

## 2023-03-29 ENCOUNTER — Encounter (HOSPITAL_COMMUNITY): Payer: Self-pay | Admitting: *Deleted

## 2023-03-29 DIAGNOSIS — Z5321 Procedure and treatment not carried out due to patient leaving prior to being seen by health care provider: Secondary | ICD-10-CM | POA: Diagnosis not present

## 2023-03-29 DIAGNOSIS — H5789 Other specified disorders of eye and adnexa: Secondary | ICD-10-CM | POA: Insufficient documentation

## 2023-03-29 NOTE — ED Triage Notes (Signed)
Pt c/o feeling like something is in his left eye. Watery drainage.

## 2023-03-29 NOTE — ED Notes (Signed)
Called pt x4 for room, no response. 

## 2023-06-19 ENCOUNTER — Other Ambulatory Visit: Payer: Self-pay

## 2023-06-19 ENCOUNTER — Emergency Department (HOSPITAL_COMMUNITY)
Admission: EM | Admit: 2023-06-19 | Discharge: 2023-06-19 | Discharge status: Against Medical Advice | Payer: Self-pay | Attending: Emergency Medicine | Admitting: Emergency Medicine

## 2023-06-19 DIAGNOSIS — Z5321 Procedure and treatment not carried out due to patient leaving prior to being seen by health care provider: Secondary | ICD-10-CM | POA: Insufficient documentation

## 2023-06-19 DIAGNOSIS — R0981 Nasal congestion: Secondary | ICD-10-CM | POA: Insufficient documentation

## 2023-06-19 DIAGNOSIS — J029 Acute pharyngitis, unspecified: Secondary | ICD-10-CM | POA: Insufficient documentation

## 2023-06-19 DIAGNOSIS — R067 Sneezing: Secondary | ICD-10-CM | POA: Insufficient documentation

## 2023-06-19 DIAGNOSIS — Z1152 Encounter for screening for COVID-19: Secondary | ICD-10-CM | POA: Insufficient documentation

## 2023-06-19 LAB — RESP PANEL BY RT-PCR (RSV, FLU A&B, COVID)  RVPGX2
Influenza A by PCR: NEGATIVE
Influenza B by PCR: NEGATIVE
Resp Syncytial Virus by PCR: NEGATIVE
SARS Coronavirus 2 by RT PCR: NEGATIVE

## 2023-06-19 NOTE — ED Notes (Signed)
 Patient LWBS, dragging OTF.

## 2023-06-19 NOTE — ED Triage Notes (Signed)
 Pt with sneezing, nasal congestion, and sore throat x 3 days. Improving with OTC meds but reports continued sore throat.

## 2023-06-27 ENCOUNTER — Emergency Department (HOSPITAL_COMMUNITY)
Admission: EM | Admit: 2023-06-27 | Discharge: 2023-06-27 | Discharge status: Against Medical Advice | Payer: No Typology Code available for payment source | Attending: Emergency Medicine | Admitting: Emergency Medicine

## 2023-06-27 ENCOUNTER — Other Ambulatory Visit: Payer: Self-pay

## 2023-06-27 ENCOUNTER — Encounter (HOSPITAL_COMMUNITY): Payer: Self-pay

## 2023-06-27 ENCOUNTER — Encounter (HOSPITAL_COMMUNITY): Payer: Self-pay | Admitting: *Deleted

## 2023-06-27 ENCOUNTER — Emergency Department (HOSPITAL_COMMUNITY)
Admission: EM | Admit: 2023-06-27 | Discharge: 2023-06-27 | Discharge status: Against Medical Advice | Payer: No Typology Code available for payment source | Source: Home / Self Care

## 2023-06-27 DIAGNOSIS — M542 Cervicalgia: Secondary | ICD-10-CM | POA: Diagnosis not present

## 2023-06-27 DIAGNOSIS — R221 Localized swelling, mass and lump, neck: Secondary | ICD-10-CM | POA: Insufficient documentation

## 2023-06-27 DIAGNOSIS — R519 Headache, unspecified: Secondary | ICD-10-CM | POA: Insufficient documentation

## 2023-06-27 DIAGNOSIS — F17200 Nicotine dependence, unspecified, uncomplicated: Secondary | ICD-10-CM | POA: Insufficient documentation

## 2023-06-27 DIAGNOSIS — Z5321 Procedure and treatment not carried out due to patient leaving prior to being seen by health care provider: Secondary | ICD-10-CM | POA: Insufficient documentation

## 2023-06-27 DIAGNOSIS — J029 Acute pharyngitis, unspecified: Secondary | ICD-10-CM | POA: Insufficient documentation

## 2023-06-27 HISTORY — DX: Unspecified asthma, uncomplicated: J45.909

## 2023-06-27 LAB — BASIC METABOLIC PANEL
Anion gap: 9 (ref 5–15)
BUN: 11 mg/dL (ref 8–23)
CO2: 20 mmol/L — ABNORMAL LOW (ref 22–32)
Calcium: 9.2 mg/dL (ref 8.9–10.3)
Chloride: 107 mmol/L (ref 98–111)
Creatinine, Ser: 1 mg/dL (ref 0.61–1.24)
GFR, Estimated: >60 mL/min (ref >60)
Glucose, Bld: 98 mg/dL (ref 70–99)
Potassium: 3.8 mmol/L (ref 3.5–5.1)
Sodium: 136 mmol/L (ref 135–145)

## 2023-06-27 LAB — CBC WITH DIFFERENTIAL/PLATELET
Abs Immature Granulocytes: 0.02 10*3/uL (ref 0.00–0.07)
Basophils Absolute: 0 10*3/uL (ref 0.0–0.1)
Basophils Relative: 1 %
Eosinophils Absolute: 0.2 10*3/uL (ref 0.0–0.5)
Eosinophils Relative: 3 %
HCT: 46.9 % (ref 39.0–52.0)
Hemoglobin: 15.5 g/dL (ref 13.0–17.0)
Immature Granulocytes: 0 %
Lymphocytes Relative: 38 %
Lymphs Abs: 2.8 10*3/uL (ref 0.7–4.0)
MCH: 31.4 pg (ref 26.0–34.0)
MCHC: 33 g/dL (ref 30.0–36.0)
MCV: 94.9 fL (ref 80.0–100.0)
Monocytes Absolute: 0.6 10*3/uL (ref 0.1–1.0)
Monocytes Relative: 9 %
Neutro Abs: 3.7 10*3/uL (ref 1.7–7.7)
Neutrophils Relative %: 49 %
Platelets: 224 10*3/uL (ref 150–400)
RBC: 4.94 MIL/uL (ref 4.22–5.81)
RDW: 15.6 % — ABNORMAL HIGH (ref 11.5–15.5)
WBC: 7.4 10*3/uL (ref 4.0–10.5)
nRBC: 0 % (ref 0.0–0.2)

## 2023-06-27 LAB — GROUP A STREP BY PCR: Group A Strep by PCR: NOT DETECTED

## 2023-06-27 NOTE — ED Triage Notes (Signed)
 Patient said he was exposed to cold air, got an infection in his throat. Has been going on for 2 weeks. Stated he has been using lozenges, which have been helping. This has happened 2 times before where he got antibiotics and it helped.

## 2023-06-27 NOTE — ED Triage Notes (Signed)
 2 weeks sore throat and headache. Reporting swelling area in his neck.

## 2023-06-27 NOTE — ED Provider Triage Note (Signed)
 Emergency Medicine Provider Triage Evaluation Note  Steven Cobb , a 63 y.o. male  was evaluated in triage.  Pt complains of neck pain. Pt noticed a mass to R side of neck x 2 weeks.  It's tender. No voice changes.  Has had similar swelling in the past, relieved with abx.  Does smoke and drink.  Had negative covid, flu, rsv test last week.  No hx of cancer.  Does have some cold sxs  Review of Systems  Positive: As above Negative: As above  Physical Exam  BP 126/89   Pulse 81   Temp 98.6 F (37 C) (Oral)   Resp 18   Ht 5' 8 (1.727 m)   Wt 79.4 kg   SpO2 100%   BMI 26.61 kg/m  Gen:   Awake, no distress   Resp:  Normal effort  MSK:   Moves extremities without difficulty  Other:    Medical Decision Making  Medically screening exam initiated at 4:31 PM.  Appropriate orders placed.  Steven Cobb was informed that the remainder of the evaluation will be completed by another provider, this initial triage assessment does not replace that evaluation, and the importance of remaining in the ED until their evaluation is complete.     Nivia Colon, PA-C 06/27/23 [phone]

## 2023-06-27 NOTE — ED Notes (Signed)
 Stickers found on triage desk, pt not found

## 2023-06-27 NOTE — ED Notes (Signed)
 Pt was here prior, had blood work complete and left without completing his care

## 2023-06-28 ENCOUNTER — Encounter (HOSPITAL_COMMUNITY): Payer: Self-pay | Admitting: Emergency Medicine

## 2023-06-28 ENCOUNTER — Emergency Department (HOSPITAL_COMMUNITY)
Admission: EM | Admit: 2023-06-28 | Discharge: 2023-06-28 | Discharge status: Against Medical Advice | Payer: No Typology Code available for payment source | Attending: Emergency Medicine | Admitting: Emergency Medicine

## 2023-06-28 DIAGNOSIS — Z5329 Procedure and treatment not carried out because of patient's decision for other reasons: Secondary | ICD-10-CM | POA: Insufficient documentation

## 2023-06-28 DIAGNOSIS — R221 Localized swelling, mass and lump, neck: Secondary | ICD-10-CM | POA: Insufficient documentation

## 2023-06-28 MED ORDER — AMOXICILLIN-POT CLAVULANATE 875-125 MG PO TABS: 1.0000 | ORAL_TABLET | Freq: Two times a day (BID) | ORAL | 0 refills | Status: AC

## 2023-06-28 MED ORDER — ACETAMINOPHEN 500 MG PO TABS: 1000.0000 mg | ORAL_TABLET | Freq: Once | ORAL | Status: DC

## 2023-06-28 MED ORDER — PREDNISONE 10 MG (21) PO TBPK: ORAL_TABLET | Freq: Every day | ORAL | 0 refills | Status: AC

## 2023-06-28 MED ORDER — DEXAMETHASONE SODIUM PHOSPHATE 10 MG/ML IJ SOLN: 10.0000 mg | Freq: Once | INTRAMUSCULAR | Status: DC

## 2023-06-28 MED ORDER — IOHEXOL 300 MG/ML  SOLN: 75.0000 mL | Freq: Once | INTRAMUSCULAR | Status: DC | PRN

## 2023-06-28 NOTE — ED Notes (Signed)
 Patient noted not to be in room. Provider aware

## 2023-06-28 NOTE — ED Provider Notes (Signed)
 [facility] AT [facility] Provider Note   CSN: [id number] Arrival date & time: 06/28/23  9351     History  Chief Complaint  Patient presents with   Oral Swelling    Steven Cobb is a 63 y.o. male.  HPI Patient presents to the ED with sore throat, odynophagia, right sided neck swelling for the last 2 weeks.  States he has subjective fevers of 99.77F with chills, headache.  Was seen last Friday and had labs drawn but left due to needing to self treat for pain.  He has been treating himself with smoking marijuana and taking eight 250 mg tablets of ibuprofen  and has been doing this 5-6 times a day for the last 2 weeks.  States he has been unable to tolerate fluids or eating due to pain.  Says that when he begins drinking, the swelling gets much worse.  States that his voice is normal when he does his pain virtual but that it is changing when the pain was present.  States he had been treated for this before in May 2022 and was given amoxicillin  which helped.   Denies chest pain, shortness of breath, cough, abdominal pain, nausea, vomiting.  Home Medications Prior to Admission medications   Medication Sig Start Date End Date Taking? Authorizing Provider  albuterol  (PROVENTIL  HFA;VENTOLIN  HFA) 108 (90 Base) MCG/ACT inhaler Inhale 2 puffs into the lungs every 4 (four) hours as needed for wheezing or shortness of breath. Patient not taking: Reported on 08/27/2019 08/25/18   Joy, Elouise C, PA-C  amoxicillin  (AMOXIL ) 500 MG capsule Take 1 capsule (500 mg total) by mouth 3 (three) times daily. 12/06/19   Steinl, Kevin, MD  benzonatate  (TESSALON ) 100 MG capsule Take 1 capsule (100 mg total) by mouth every 8 (eight) hours. 06/11/20   Joldersma, Logan, PA-C  fluticasone  (FLONASE ) 50 MCG/ACT nasal spray Place 2 sprays into both nostrils daily. Patient not taking: Reported on 08/27/2019 08/25/18   Joy, Elouise C, PA-C  ibuprofen  (ADVIL ,MOTRIN ) 600 MG tablet Take 1 tablet  (600 mg total) by mouth every 8 (eight) hours as needed. For pain; take after eating Patient not taking: Reported on 08/27/2019 04/22/18   Fulp, Lannie, MD  loperamide  (IMODIUM ) 2 MG capsule Take 1 capsule (2 mg total) by mouth 4 (four) times daily as needed for diarrhea or loose stools. Patient not taking: Reported on 08/27/2019 10/20/18   Hedges, Reyes, PA-C  methocarbamol  (ROBAXIN ) 750 MG tablet Take 1 tablet (750 mg total) by mouth 3 (three) times daily as needed (muscle spasm/pain). 12/06/19   Bernard Drivers, MD  omeprazole  (PRILOSEC) 20 MG capsule Take 1 capsule (20 mg total) by mouth 2 (two) times daily. To reduce stomach acid Patient not taking: Reported on 08/27/2019 04/22/18   Fulp, Lannie, MD  ondansetron  (ZOFRAN ) 4 MG tablet Take 1 tablet (4 mg total) by mouth every 6 (six) hours. Patient not taking: Reported on 08/27/2019 10/20/18   Hedges, Reyes, PA-C  predniSONE  (STERAPRED UNI-PAK 21 TAB) 10 MG (21) TBPK tablet Take 6 tabs (60mg ) day 1, 5 tabs (50mg ) day 2, 4 tabs (40mg ) day 3, 3 tabs (30mg ) day 4, 2 tabs (20mg ) day 5, and 1 tab (10mg ) day 6. Patient not taking: Reported on 08/27/2019 08/25/18   Joy, Elouise BROCKS, PA-C  ranitidine  (ZANTAC ) 150 MG capsule Take 1 capsule (150 mg total) by mouth daily. Patient not taking: Reported on 08/27/2019 10/20/18   Palmer Reyes, PA-C      Allergies  Sulfa antibiotics    Review of Systems   Review of Systems  Constitutional:  Positive for chills and fever.  HENT:  Positive for sore throat and trouble swallowing.     Physical Exam Updated Vital Signs BP (!) 163/111 (BP Location: Right Arm)   Pulse 86   Temp 98.1 F (36.7 C) (Oral)   Resp 19   SpO2 100%  Physical Exam Vitals and nursing note reviewed.  Constitutional:      General: He is not in acute distress.    Appearance: Normal appearance. He is not toxic-appearing.  HENT:     Head: Normocephalic and atraumatic.     Jaw: Pain on movement present. No trismus or tenderness.     Salivary  Glands: Right salivary gland is diffusely enlarged and tender.     Comments: Noticeable right sided swelling noted under the mandible.  It is tender to the touch.  Patient still has full range of motion of cervical spine without difficulty.    Nose: Nose normal. No congestion.     Mouth/Throat:     Mouth: Mucous membranes are moist.     Pharynx: Uvula midline. Posterior oropharyngeal erythema present. No pharyngeal swelling or uvula swelling.     Tonsils: No tonsillar exudate or tonsillar abscesses.  Eyes:     Extraocular Movements: Extraocular movements intact.     Conjunctiva/sclera: Conjunctivae normal.  Cardiovascular:     Rate and Rhythm: Normal rate and regular rhythm.     Pulses: Normal pulses.     Heart sounds: Normal heart sounds. No murmur heard.    No friction rub. No gallop.  Pulmonary:     Effort: Pulmonary effort is normal. No respiratory distress.     Breath sounds: Normal breath sounds.  Abdominal:     General: Abdomen is flat.     Palpations: Abdomen is soft.     Tenderness: There is no abdominal tenderness.  Skin:    General: Skin is warm and dry.  Neurological:     General: No focal deficit present.     Mental Status: He is alert. Mental status is at baseline.  Psychiatric:        Mood and Affect: Mood normal.     ED Results / Procedures / Treatments   Labs (all labs ordered are listed, but only abnormal results are displayed) Labs Reviewed  CBC WITH DIFFERENTIAL/PLATELET  BASIC METABOLIC PANEL    EKG None  Radiology No results found.  Procedures Procedures    Medications Ordered in ED Medications  dexamethasone  (DECADRON ) injection 10 mg (has no administration in time range)  acetaminophen  (TYLENOL ) tablet 1,000 mg (has no administration in time range)  iohexol  (OMNIPAQUE ) 300 MG/ML solution 75 mL (has no administration in time range)    ED Course/ Medical Decision Making/ A&P                                 Medical Decision  Making  This patient is a 63 year old male who presents to the ED for concern of right-sided neck swelling, fever, odynophagia.   Differential diagnoses prior to evaluation: The emergent differential diagnosis includes, but is not limited to, Ludwig syndrome, retropharyngeal abscess, submandibular sialadenitis, dental abscess, cervical lymphadenitis. This is not an exhaustive differential.   Past Medical History / Co-morbidities / Social History: Asthma  Additional history: Chart reviewed. Pertinent results include:   Was seen yesterday but eloped.  Previously seen  on 11/14/2020 for dental infection was sent home with amoxicillin .  Lab Tests/Imaging studies: I personally interpreted labs/imaging and the pertinent results include:   CT head and neck pending CBC and BMP refused  Medications: No medications ordered at this time.  Advised that if patient is against experiencing pain to request pain meds. I have reviewed the patients home medicines and have made adjustments as needed.   ED Course:  Patient is 63 year old male who presents to the ED today complaining of right sided neck swelling, subjective fevers, chills, odynophagia.  Previously treated for dental infection in 2022.  States that this is very similar to that time.  Says that he is currently taking around 2000 mg of ibuprofen  4-5 times a day along with smoking a joint which he describes as a his pain ritual.   Not any current pain at this time.  However due to presenting symptoms and limits also complaining of stiff neck when pain is present, ordered CT of head and neck to evaluate for ruling out emergent process.  Patient labs were drawn from last night and are shown to be unremarkable compared to past labs.  However we will draw new labs considering the large amount of ibuprofen  he is currently ingesting.  Patient appeared to be very irritable on exam.  With notable swelling to the right submandibular region of his neck.  It  is tender to the touch.  Poor dentition noted with most missing.  Uvula was midline with noticeable erythema to the back of throat.  The rest of the physical exam was unremarkable.  I advised patient to request pain medication if needed/if pain returned.  Patient states that he is close to going the hospital because of taking labs.  Patient then refused to have new labs drawn.  Attempts were going to be made to persuade patient to do labs and imaging however was unable to contact or see patient again due to patient not being in the room.  Advised from nursing that patient then eloped after saying that he was going to smoke a joint to help with pain.  Further evaluation could not then be done.    Disposition: Patient eloped before receiving treatment.    Final Clinical Impression(s) / ED Diagnoses Final diagnoses:  Neck swelling    Rx / DC Orders ED Discharge Orders     None         Beola Terrall RAMAN, PA-C 06/28/23 0858    Elnor Jayson LABOR, DO 06/28/23 1000

## 2023-06-28 NOTE — ED Triage Notes (Signed)
 Pt has swelling to R side of throat. Has hx of salivary stones. Taking OTC ibuprofen.

## 2023-06-28 NOTE — ED Notes (Signed)
 Patient refuses IV and blood draw. States "I was just here last night and they can use that blood. I have less than a 2hr window before my pain comes back and I have to smoke more weed". Provider informed.
